# Patient Record
Sex: Female | Born: 1965
Health system: Southern US, Community
[De-identification: ages and names within clinical notes are randomized; demographics above are authoritative.]

## PROBLEM LIST (undated history)

## (undated) DIAGNOSIS — T7840XA Allergy, unspecified, initial encounter: Secondary | ICD-10-CM

## (undated) HISTORY — PX: BREAST SURGERY: SHX581

## (undated) HISTORY — PX: BREAST ENHANCEMENT SURGERY: SHX7

## (undated) HISTORY — DX: Allergy, unspecified, initial encounter: T78.40XA

## (undated) HISTORY — PX: EYE SURGERY: SHX253

---

## 1999-12-19 ENCOUNTER — Emergency Department (HOSPITAL_COMMUNITY): Admission: EM | Admit: 1999-12-19 | Discharge: 1999-12-20 | Payer: Self-pay | Admitting: Emergency Medicine

## 1999-12-20 ENCOUNTER — Encounter: Payer: Self-pay | Admitting: Emergency Medicine

## 2000-01-11 ENCOUNTER — Other Ambulatory Visit: Admission: RE | Admit: 2000-01-11 | Discharge: 2000-01-11 | Payer: Self-pay | Admitting: Obstetrics and Gynecology

## 2002-04-22 ENCOUNTER — Other Ambulatory Visit: Admission: RE | Admit: 2002-04-22 | Discharge: 2002-04-22 | Payer: Self-pay | Admitting: Obstetrics and Gynecology

## 2004-04-04 ENCOUNTER — Other Ambulatory Visit: Admission: RE | Admit: 2004-04-04 | Discharge: 2004-04-04 | Payer: Self-pay | Admitting: Obstetrics and Gynecology

## 2004-09-06 ENCOUNTER — Ambulatory Visit (HOSPITAL_COMMUNITY): Admission: RE | Admit: 2004-09-06 | Discharge: 2004-09-06 | Payer: Self-pay | Admitting: Obstetrics and Gynecology

## 2005-05-20 ENCOUNTER — Other Ambulatory Visit: Admission: RE | Admit: 2005-05-20 | Discharge: 2005-05-20 | Payer: Self-pay | Admitting: Obstetrics and Gynecology

## 2011-08-07 ENCOUNTER — Ambulatory Visit: Payer: Self-pay | Admitting: Internal Medicine

## 2011-08-07 ENCOUNTER — Ambulatory Visit (INDEPENDENT_AMBULATORY_CARE_PROVIDER_SITE_OTHER): Payer: BC Managed Care – PPO | Admitting: Internal Medicine

## 2011-08-07 DIAGNOSIS — M545 Low back pain, unspecified: Secondary | ICD-10-CM

## 2011-09-17 ENCOUNTER — Ambulatory Visit (INDEPENDENT_AMBULATORY_CARE_PROVIDER_SITE_OTHER): Payer: BC Managed Care – PPO | Admitting: Internal Medicine

## 2011-09-17 VITALS — BP 108/71 | HR 91 | Temp 97.8°F | Resp 16 | Ht 65.0 in | Wt 142.0 lb

## 2011-09-17 DIAGNOSIS — N390 Urinary tract infection, site not specified: Secondary | ICD-10-CM

## 2011-09-17 DIAGNOSIS — J309 Allergic rhinitis, unspecified: Secondary | ICD-10-CM | POA: Insufficient documentation

## 2011-09-17 DIAGNOSIS — R300 Dysuria: Secondary | ICD-10-CM

## 2011-09-17 DIAGNOSIS — J301 Allergic rhinitis due to pollen: Secondary | ICD-10-CM

## 2011-09-17 LAB — POCT URINALYSIS DIPSTICK
Bilirubin, UA: NEGATIVE
Blood, UA: NEGATIVE
Glucose, UA: NEGATIVE
Ketones, UA: NEGATIVE
Nitrite, UA: NEGATIVE
Protein, UA: NEGATIVE
Spec Grav, UA: <=1.005
Urobilinogen, UA: 0.2
pH, UA: 6

## 2011-09-17 LAB — POCT UA - MICROSCOPIC ONLY
Amorphous: POSITIVE
Casts, Ur, LPF, POC: NEGATIVE
Crystals, Ur, HPF, POC: NEGATIVE
Mucus, UA: NEGATIVE
Yeast, UA: NEGATIVE

## 2011-09-17 MED ORDER — FLUTICASONE PROPIONATE 50 MCG/ACT NA SUSP: 2.0000 | Freq: Every day | NASAL | Status: DC

## 2011-09-17 MED ORDER — CIPROFLOXACIN HCL 250 MG PO TABS: 250.0000 mg | ORAL_TABLET | Freq: Two times a day (BID) | ORAL | Status: DC

## 2011-09-17 MED ORDER — CIPROFLOXACIN HCL 250 MG PO TABS: 250.0000 mg | ORAL_TABLET | Freq: Two times a day (BID) | ORAL | Status: AC

## 2011-09-17 NOTE — Progress Notes (Signed)
  Subjective:    Patient ID: Rachael Acosta, female    DOB: 03-10-1966, 46 y.o.   MRN: [id number]  HPI Presents with a 5 day history of frequency urgency dysuria and a feeling of incomplete void Positive suprapubic pressure but no pain or hematuria and no fever C. History of past UTIs/no sexual activity in at least 6 months  Also complaining of bifrontal headaches-in the past these were treating her with allergy medicine/she is on loratadine nail but is beginning to have bifrontal headaches again/recently has had some postnasal drip and sneezing   Review of SystemsNo recent complaint Social history-for the past 3 years she has been interviewing for a 47 year old female as a possible companion in a very slow and deliberate relationship which has been healthy for her after that terrible problems of her past marriage     Objective:   Physical ExamVital signs stable HEENT is clear There no CVA tenderness No suprapubic tenderness    Results for orders placed in visit on 09/17/11  POCT UA - MICROSCOPIC ONLY      Component Value Range   WBC, Ur, HPF, POC 0-2     RBC, urine, microscopic 0-1     Bacteria, U Microscopic trace     Mucus, UA neg     Epithelial cells, urine per micros 0-3     Crystals, Ur, HPF, POC neg     Casts, Ur, LPF, POC neg     Yeast, UA neg     Amorphous pos    POCT URINALYSIS DIPSTICK      Component Value Range   Color, UA light yellow     Clarity, UA clear     Glucose, UA neg     Bilirubin, UA neg     Ketones, UA neg     Spec Grav, UA <=1.005     Blood, UA neg     pH, UA 6.0     Protein, UA neg     Urobilinogen, UA 0.2     Nitrite, UA neg     Leukocytes, UA Trace     Urine culture ordered Assessment & Plan:  Problem #1 dysuria frequency Culture done Start Cipro 250 twice a day  Problem #2 allergic rhinitis with possible sinus headache  Flonase added to loratadine and call if not better Followup for CPE as scheduled in April

## 2011-09-19 ENCOUNTER — Telehealth: Payer: Self-pay

## 2011-09-19 ENCOUNTER — Encounter: Payer: Self-pay | Admitting: Internal Medicine

## 2011-09-19 LAB — URINE CULTURE: Colony Count: >=100000

## 2011-09-19 NOTE — Telephone Encounter (Signed)
.  [facility]    PT REQUESTING MEDS FOR YEAST INFECTIION   BEST PHONE [phone]  PHARMACY [facility]

## 2011-09-20 MED ORDER — FLUCONAZOLE 150 MG PO TABS: 150.0000 mg | ORAL_TABLET | Freq: Once | ORAL | Status: AC

## 2011-09-20 NOTE — Telephone Encounter (Signed)
Patient given Cipro on 3/5... Ok to rx Diflucan?

## 2011-09-20 NOTE — Telephone Encounter (Signed)
Sent in

## 2011-09-20 NOTE — Telephone Encounter (Signed)
Pt notified of rx at pharmacy

## 2011-10-16 ENCOUNTER — Encounter: Payer: Self-pay | Admitting: Internal Medicine

## 2011-10-16 ENCOUNTER — Ambulatory Visit (INDEPENDENT_AMBULATORY_CARE_PROVIDER_SITE_OTHER): Payer: BC Managed Care – PPO | Admitting: Internal Medicine

## 2011-10-16 VITALS — BP 113/77 | HR 81 | Temp 98.4°F | Resp 16 | Ht 64.5 in | Wt 140.2 lb

## 2011-10-16 DIAGNOSIS — N390 Urinary tract infection, site not specified: Secondary | ICD-10-CM

## 2011-10-16 DIAGNOSIS — Z0000 Encounter for general adult medical examination without abnormal findings: Secondary | ICD-10-CM

## 2011-10-16 LAB — COMPREHENSIVE METABOLIC PANEL
ALT: 10 U/L (ref 0–35)
CO2: 26 mEq/L (ref 19–32)
Creat: 1 mg/dL (ref 0.50–1.10)
Total Bilirubin: 0.4 mg/dL (ref 0.3–1.2)

## 2011-10-16 LAB — POCT UA - MICROSCOPIC ONLY
Casts, Ur, LPF, POC: NEGATIVE
Crystals, Ur, HPF, POC: NEGATIVE
Mucus, UA: NEGATIVE
Yeast, UA: NEGATIVE

## 2011-10-16 LAB — LIPID PANEL
HDL: 60 mg/dL (ref >39)
Total CHOL/HDL Ratio: 2.4 Ratio
Triglycerides: 73 mg/dL (ref <150)

## 2011-10-16 LAB — CBC WITH DIFFERENTIAL/PLATELET
Basophils Absolute: 0 10*3/uL (ref 0.0–0.1)
Eosinophils Absolute: 0.1 10*3/uL (ref 0.0–0.7)
Eosinophils Relative: 4 % (ref 0–5)
MCH: 30.7 pg (ref 26.0–34.0)
MCV: 95.7 fL (ref 78.0–100.0)
Platelets: 233 10*3/uL (ref 150–400)
RDW: 13 % (ref 11.5–15.5)

## 2011-10-16 LAB — POCT URINALYSIS DIPSTICK
Blood, UA: NEGATIVE
Ketones, UA: NEGATIVE
Leukocytes, UA: NEGATIVE
Protein, UA: NEGATIVE
Spec Grav, UA: 1.015
pH, UA: 7

## 2011-10-16 LAB — TSH: TSH: 0.851 u[IU]/mL (ref 0.350–4.500)

## 2011-10-16 NOTE — Progress Notes (Signed)
  Subjective:    Patient ID: Rachael Acosta, female    DOB: 1966/06/29, 46 y.o.   MRN: [id number]  HPI sHe is doing very well in general/,Continues to work in retail,Work works out regularly, and has an interesting life. She lives in her arranged marriage for almost 20 years but was never satisfactory. She now is in a new relationship that is more one of companionship and she has several questions about how she should feel in this relationship.  She had a recent illness 4 days ago that started with fever and night sweats but without other focal findings/she is still tired and washed out from this illness  She had allergic rhinitis earlier this month which has improved with Flonase and Claritin   Review of Systems  Constitutional: Negative.   HENT: Negative.   Eyes: Negative.   Respiratory: Negative.   Cardiovascular: Negative.   Gastrointestinal: Negative.   Genitourinary:       She has had several UTIs and recently was treated though her culture proved negative. She has known fibroids and is followed by Dr. Jackelyn Knife. She has an upcoming appointment with him to discuss whether her lower quadrant symptoms with occasional urinary frequency are related to a GYN problem. She has questions about an unfulfilled intimate life which we discussed at length/referred to our bodies ourselves  Musculoskeletal: Negative.   Skin: Negative.   Neurological: Negative.   Hematological: Negative.   Psychiatric/Behavioral: Negative.        Objective:   Physical Exam Vital signs stable Well-developed well-nourished in no acute distress HEENT is clear without thyromegaly or lymphadenopathy Heart is regular without murmurs rubs or gallops Breasts are augmented Lungs are clear The abdomen is soft nontender and nondistended with no organomegaly There is a full range of motion about the hips knees and shoulders No peripheral edema/4 peripheral pulses Neurological intact Psychiatric intact         Assessment & Plan:  Annual exam Problem #1 allergic rhinitis  Continue Flonase and Claritin Problem #2 concern for thyroid illness or diabetes on her part  Will screen for both  In general she is very healthy and is encouraged to continue what she is doing

## 2011-10-17 ENCOUNTER — Encounter: Payer: Self-pay | Admitting: Internal Medicine

## 2011-10-22 ENCOUNTER — Telehealth: Payer: Self-pay

## 2011-10-22 NOTE — Telephone Encounter (Signed)
PATIENT WANTS TO KNOW IF HER LAB RESULTS ARE BACK

## 2011-10-23 NOTE — Telephone Encounter (Signed)
Call her I mailed her a letter with all of her lab results on the seventh Everything is normal

## 2011-10-24 NOTE — Telephone Encounter (Signed)
Gave pt Dr Doolittle's message

## 2011-12-04 ENCOUNTER — Other Ambulatory Visit: Payer: Self-pay | Admitting: Family Medicine

## 2011-12-04 MED ORDER — LORATADINE 10 MG PO TABS: 10.0000 mg | ORAL_TABLET | Freq: Every day | ORAL | Status: DC

## 2012-01-12 ENCOUNTER — Ambulatory Visit (INDEPENDENT_AMBULATORY_CARE_PROVIDER_SITE_OTHER): Payer: BC Managed Care – PPO | Admitting: Internal Medicine

## 2012-01-12 VITALS — BP 104/67 | HR 67 | Temp 98.2°F | Resp 16 | Ht 64.0 in | Wt 141.8 lb

## 2012-01-12 DIAGNOSIS — G8929 Other chronic pain: Secondary | ICD-10-CM

## 2012-01-12 DIAGNOSIS — R1032 Left lower quadrant pain: Secondary | ICD-10-CM

## 2012-01-12 DIAGNOSIS — G4700 Insomnia, unspecified: Secondary | ICD-10-CM

## 2012-01-12 LAB — POCT URINALYSIS DIPSTICK
Leukocytes, UA: NEGATIVE
Protein, UA: NEGATIVE
Urobilinogen, UA: 0.2
pH, UA: 6.5

## 2012-01-12 LAB — POCT UA - MICROSCOPIC ONLY
Amorphous: POSITIVE
Casts, Ur, LPF, POC: NEGATIVE
Yeast, UA: NEGATIVE

## 2012-01-12 MED ORDER — ZOLPIDEM TARTRATE 10 MG PO TABS: 10.0000 mg | ORAL_TABLET | Freq: Every evening | ORAL | Status: DC | PRN

## 2012-01-12 NOTE — Patient Instructions (Signed)
Back Pain, Adult Low back pain is very common. About 1 in 5 people have back pain.The cause of low back pain is rarely dangerous. The pain often gets better over time.About half of people with a sudden onset of back pain feel better in just 2 weeks. About 8 in 10 people feel better by 6 weeks.  CAUSES Some common causes of back pain include:  Strain of the muscles or ligaments supporting the spine.   Wear and tear (degeneration) of the spinal discs.   Arthritis.   Direct injury to the back.  DIAGNOSIS Most of the time, the direct cause of low back pain is not known.However, back pain can be treated effectively even when the exact cause of the pain is unknown.Answering your caregiver's questions about your overall health and symptoms is one of the most accurate ways to make sure the cause of your pain is not dangerous. If your caregiver needs more information, he or she may order lab work or imaging tests (X-rays or MRIs).However, even if imaging tests show changes in your back, this usually does not require surgery. HOME CARE INSTRUCTIONS For many people, back pain returns.Since low back pain is rarely dangerous, it is often a condition that people can learn to manageon their own.   Remain active. It is stressful on the back to sit or stand in one place. Do not sit, drive, or stand in one place for more than 30 minutes at a time. Take short walks on level surfaces as soon as pain allows.Try to increase the length of time you walk each day.   Do not stay in bed.Resting more than 1 or 2 days can delay your recovery.   Do not avoid exercise or work.Your body is made to move.It is not dangerous to be active, even though your back may hurt.Your back will likely heal faster if you return to being active before your pain is gone.   Pay attention to your body when you bend and lift. Many people have less discomfortwhen lifting if they bend their knees, keep the load close to their  bodies,and avoid twisting. Often, the most comfortable positions are those that put less stress on your recovering back.   Find a comfortable position to sleep. Use a firm mattress and lie on your side with your knees slightly bent. If you lie on your back, put a pillow under your knees.   Only take over-the-counter or prescription medicines as directed by your caregiver. Over-the-counter medicines to reduce pain and inflammation are often the most helpful.Your caregiver may prescribe muscle relaxant drugs.These medicines help dull your pain so you can more quickly return to your normal activities and healthy exercise.   Put ice on the injured area.   Put ice in a plastic bag.   Place a towel between your skin and the bag.   Leave the ice on for 15 to 20 minutes, 3 to 4 times a day for the first 2 to 3 days. After that, ice and heat may be alternated to reduce pain and spasms.   Ask your caregiver about trying back exercises and gentle massage. This may be of some benefit.   Avoid feeling anxious or stressed.Stress increases muscle tension and can worsen back pain.It is important to recognize when you are anxious or stressed and learn ways to manage it.Exercise is a great option.  SEEK MEDICAL CARE IF:  You have pain that is not relieved with rest or medicine.   You have   pain that does not improve in 1 week.   You have new symptoms.   You are generally not feeling well.  SEEK IMMEDIATE MEDICAL CARE IF:   You have pain that radiates from your back into your legs.   You develop new bowel or bladder control problems.   You have unusual weakness or numbness in your arms or legs.   You develop nausea or vomiting.   You develop abdominal pain.   You feel faint.  Document Released: 07/01/2005 Document Revised: 06/20/2011 Document Reviewed: 11/19/2010 [facility] Patient Information 2012 [place], [place].Abdominal Pain Abdominal pain can be caused by many things. Your caregiver  decides the seriousness of your pain by an examination and possibly blood tests and X-rays. Many cases can be observed and treated at home. Most abdominal pain is not caused by a disease and will probably improve without treatment. However, in many cases, more time must pass before a clear cause of the pain can be found. Before that point, it may not be known if you need more testing, or if hospitalization or surgery is needed. HOME CARE INSTRUCTIONS   Do not take laxatives unless directed by your caregiver.   Take pain medicine only as directed by your caregiver.   Only take over-the-counter or prescription medicines for pain, discomfort, or fever as directed by your caregiver.   Try a clear liquid diet (broth, tea, or water) for as long as directed by your caregiver. Slowly move to a bland diet as tolerated.  SEEK IMMEDIATE MEDICAL CARE IF:   The pain does not go away.   You have a fever.   You keep throwing up (vomiting).   The pain is felt only in portions of the abdomen. Pain in the right side could possibly be appendicitis. In an adult, pain in the left lower portion of the abdomen could be colitis or diverticulitis.   You pass bloody or black tarry stools.  MAKE SURE YOU:   Understand these instructions.   Will watch your condition.   Will get help right away if you are not doing well or get worse.  Document Released: 04/10/2005 Document Revised: 06/20/2011 Document Reviewed: 02/17/2008 [facility] Patient Information 2012 [place], [place].

## 2012-01-12 NOTE — Progress Notes (Addendum)
  Subjective:    Patient ID: Rachael Acosta, female    DOB: [birth year], 46 y.o.   MRN: [id number]  HPI Painful abdominal LLQ known hx of fibroids.Pain from back since August, left lower lumbar radiates into LLQ has gone for therapy-[facility]'Hall[facility]- without much relief of pain. No fever last menstral cycle was normal June 1 although had shorter cycle than usual 1-2 days. Denies constipation.Not sexually active. No vaginal discharge. No dysuria or frequency. No followup with GYN regarding fibroids.  Review of Systems  Constitutional: Negative for fever, chills, activity change, appetite change and fatigue.   trouble falling asleep In the past that has responded to Ambien and she is out Her life is changing/selling her house/moving back in with her parents/left her boy friend/llooking for a new life      Objective:   Physical Exam Tenderness lower lumbar area neg SLR bilateral.  Abdomen soft with no organomegaly. Tender to deep palpation in the left lower quadrant without rebound tenderness = Pelvic= introitus clear Uterus anterior nontender Right adnexa clear/left adnexa tender to bimanual without clear mass Posterior wall of the uterus is tender No stool in the rectum       Assessment & Plan:  Problem #1 recurrent back pain Problem #2 left lower quadrant pelvic discomfort Problem #3 insomnia Problem #4 episodic urinary frequency including nocturia  Urinalysis today=Normal except for a few red cells  differential could include kidney stone or cyst ovary,outflow obstruction, or IC  Patient to be scheduled for ultrasound tomorrow to evaluate for fibroids vs ovarian cyst.  Patients mother is giving her medications which are helpful, Piroxicam, pt will continue taking this medication Meds ordered this encounter  Medications  . zolpidem (AMBIEN) 10 MG tablet    Sig: Take 1 tablet (10 mg total) by mouth at bedtime as needed for sleep.    Dispense:  15 tablet    Refill:  1   01/22/12  Addendum: Ultrasound shows small fibroids which should not account for this pain She needs orthopedic evaluation due to the persistence of her lumbar pain and urologic evaluation for #4 above

## 2012-01-13 NOTE — Addendum Note (Signed)
Addended by: Thelma Barge D on: 01/13/2012 10:15 AM   Modules accepted: Orders

## 2012-01-17 ENCOUNTER — Encounter: Payer: Self-pay | Admitting: Internal Medicine

## 2012-01-17 ENCOUNTER — Ambulatory Visit
Admission: RE | Admit: 2012-01-17 | Discharge: 2012-01-17 | Disposition: A | Discharge status: Home / Self Care | Payer: BC Managed Care – PPO | Source: Ambulatory Visit | Attending: Internal Medicine | Admitting: Internal Medicine

## 2012-01-17 ENCOUNTER — Telehealth: Payer: Self-pay | Admitting: Internal Medicine

## 2012-01-17 DIAGNOSIS — G8929 Other chronic pain: Secondary | ICD-10-CM

## 2012-01-17 DIAGNOSIS — R1032 Left lower quadrant pain: Secondary | ICD-10-CM

## 2012-01-20 NOTE — Telephone Encounter (Signed)
Decided to send letter instead

## 2012-01-22 ENCOUNTER — Telehealth: Payer: Self-pay

## 2012-01-22 DIAGNOSIS — R350 Frequency of micturition: Secondary | ICD-10-CM

## 2012-01-22 DIAGNOSIS — M545 Low back pain, unspecified: Secondary | ICD-10-CM

## 2012-01-22 NOTE — Progress Notes (Deleted)
  Subjective:    Patient ID: Rachael Acosta, female    DOB: 19-Sep-1965, 46 y.o.   MRN: [id number]  HPI    Review of Systems     Objective:   Physical Exam        Assessment & Plan:

## 2012-01-22 NOTE — Telephone Encounter (Signed)
The patient called to request test results done 01/12/12.  Please call the patient with results at work # [phone] until 6pm or on cell phone at [phone].

## 2012-01-22 NOTE — Telephone Encounter (Signed)
Pelvic ultrasound is normal except for small fibroid/she has had one ablation/This seems too small to be causing her back pain and urinary frequency Since she has day and nighttime frequency without symptoms of infection Will set up a urology evaluation to rule out obstruction and cystitis causes We will proceed also with an orthopedic evaluation for this persistent left lumbar pain with activity

## 2012-01-22 NOTE — Telephone Encounter (Signed)
Pt really wanted U/S results. Dr. Merla Riches can you please review. Thanks

## 2012-01-29 ENCOUNTER — Telehealth: Payer: Self-pay

## 2012-01-29 NOTE — Telephone Encounter (Signed)
Pharmacy changed

## 2012-01-29 NOTE — Telephone Encounter (Signed)
The patient called to have her pharmacy changed in the system to [facility] on [address] and [address] so that her next refill will go to the correct pharmacy.

## 2012-03-04 ENCOUNTER — Ambulatory Visit: Payer: BC Managed Care – PPO | Admitting: Internal Medicine

## 2012-04-10 ENCOUNTER — Ambulatory Visit (INDEPENDENT_AMBULATORY_CARE_PROVIDER_SITE_OTHER): Payer: BC Managed Care – PPO | Admitting: Physician Assistant

## 2012-04-10 VITALS — BP 112/70 | HR 93 | Temp 97.0°F | Resp 16 | Ht 64.0 in | Wt 138.0 lb

## 2012-04-10 DIAGNOSIS — R300 Dysuria: Secondary | ICD-10-CM

## 2012-04-10 DIAGNOSIS — N390 Urinary tract infection, site not specified: Secondary | ICD-10-CM

## 2012-04-10 LAB — POCT URINALYSIS DIPSTICK
Bilirubin, UA: NEGATIVE
Glucose, UA: NEGATIVE
Ketones, UA: NEGATIVE
Nitrite, UA: NEGATIVE

## 2012-04-10 LAB — POCT UA - MICROSCOPIC ONLY
Mucus, UA: NEGATIVE
Yeast, UA: NEGATIVE

## 2012-04-10 MED ORDER — CIPROFLOXACIN HCL 500 MG PO TABS: 500.0000 mg | ORAL_TABLET | Freq: Two times a day (BID) | ORAL | Status: DC

## 2012-04-10 NOTE — Progress Notes (Signed)
   [address] [place] [address]   Phone [phone]  Subjective:    Patient ID: Rachael Acosta, female    DOB: 1966/04/25, 46 y.o.   MRN: [id number]  HPI Pt presents to clinic with 12h h/o dysuria and urinary frequency.  Early this week had a couple of days that she did not drink her regular amount of water.  Symptoms started this afternoon and they have worsened.  Some abd pressure but no pain.  No flank or back pain, no N or fever.  She has had no new sexual encounters and has no vaginal d/c.   Review of Systems  Constitutional: Negative for fever and chills.  Gastrointestinal: Positive for abdominal pain (pressure). Negative for nausea.  Genitourinary: Positive for dysuria, urgency and frequency. Negative for hematuria, vaginal discharge and difficulty urinating.  Musculoskeletal: Negative for back pain.       Objective:   Physical Exam  Vitals reviewed. Constitutional: She appears well-developed and well-nourished.  HENT:  Head: Atraumatic.  Right Ear: External ear normal.  Left Ear: External ear normal.  Eyes: Conjunctivae normal are normal.  Cardiovascular: Normal rate, regular rhythm and normal heart sounds.   No murmur heard. Pulmonary/Chest: Effort normal and breath sounds normal.  Abdominal: Soft. There is tenderness (suprapubic) in the suprapubic area. There is no guarding and no CVA tenderness.  Neurological: She is alert.  Skin: Skin is warm and dry.  Psychiatric: She has a normal mood and affect. Her behavior is normal. Judgment and thought content normal.   Results for orders placed in visit on 04/10/12  POCT UA - MICROSCOPIC ONLY      Component Value Range   WBC, Ur, HPF, POC tntc     RBC, urine, microscopic tntc     Bacteria, U Microscopic 2+     Mucus, UA neg     Epithelial cells, urine per micros 2-4     Crystals, Ur, HPF, POC neg     Casts, Ur, LPF, POC neg'     Yeast, UA neg    POCT URINALYSIS DIPSTICK      Component Value Range   Color, UA pale      Clarity, UA cloudy     Glucose, UA neg     Bilirubin, UA neg     Ketones, UA neg     Spec Grav, UA 1.010     Blood, UA large     pH, UA 7.0     Protein, UA neg     Urobilinogen, UA 0.2     Nitrite, UA neg     Leukocytes, UA large (3+)           Assessment & Plan:   1. Dysuria  POCT UA - Microscopic Only, POCT urinalysis dipstick  2. Urinary tract infection, site not specified  Urine culture, ciprofloxacin (CIPRO) 500 MG tablet

## 2012-04-10 NOTE — Patient Instructions (Signed)
To take abx and push fluids.

## 2012-04-13 LAB — URINE CULTURE: Colony Count: >=100000

## 2012-05-21 ENCOUNTER — Ambulatory Visit (INDEPENDENT_AMBULATORY_CARE_PROVIDER_SITE_OTHER): Payer: BC Managed Care – PPO | Admitting: Family Medicine

## 2012-05-21 VITALS — BP 120/74 | HR 74 | Temp 97.6°F | Resp 16 | Ht 63.5 in | Wt 139.0 lb

## 2012-05-21 DIAGNOSIS — R002 Palpitations: Secondary | ICD-10-CM

## 2012-05-21 DIAGNOSIS — L282 Other prurigo: Secondary | ICD-10-CM

## 2012-05-21 DIAGNOSIS — L309 Dermatitis, unspecified: Secondary | ICD-10-CM

## 2012-05-21 LAB — POCT CBC
Granulocyte percent: 59.9 %G (ref 37–80)
HCT, POC: 40.2 % (ref 37.7–47.9)
Hemoglobin: 12.3 g/dL (ref 12.2–16.2)
MCH, POC: 29.9 pg (ref 27–31.2)
MCV: 97.5 fL — AB (ref 80–97)
MID (cbc): 0.4 (ref 0–0.9)
RBC: 4.12 M/uL (ref 4.04–5.48)
WBC: 6.8 10*3/uL (ref 4.6–10.2)

## 2012-05-21 MED ORDER — PREDNISONE 20 MG PO TABS: ORAL_TABLET | ORAL | Status: DC

## 2012-05-21 MED ORDER — METHYLPREDNISOLONE ACETATE 80 MG/ML IJ SUSP
40.0000 mg | Freq: Once | INTRAMUSCULAR | Status: AC
  Administered 2012-05-21: 40 mg via INTRAMUSCULAR

## 2012-05-21 NOTE — Progress Notes (Signed)
[address]   [place], [place]  [address]   [phone]  Subjective:    Patient ID: Rachael Acosta, female    DOB: 02/21/1966, 46 y.o.   MRN: [id number]  HPIThis 47 y.o. female presents for evaluation of the following:  1.  Rash:  Onset five days ago.  +itching.  Diffuse rash.  Onset on back and spread to abdomen, arms, legs.  Mostly upper body location.  No changes in soaps, shampoos, lotions, detergents, fabric softeners.  No new medications.  No unusual foods.  No recent travel other than [place] last week.  Friend gave topical cream and Hydroxyzine 25mg  without improvement.  No family members with similar rash.  No palm or sole involvement.  No genital involvement; no mucosal involvement.  Scalp also itches.  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  No new pets; no exposure to animals.  2. Rapid heart rate:  Rapid today.  Feels heat in ears.  No fever/chills/sweats.No sore throat, cough, rhinorrhea, nasal congestion.  No chest pain, shortness of breath, leg swelling.  Benadryl x 2 today.     Review of Systems  Constitutional: Negative for fever, chills, diaphoresis and fatigue.  HENT: Negative for congestion, sore throat, rhinorrhea, voice change and postnasal drip.   Respiratory: Negative for cough, shortness of breath and wheezing.   Cardiovascular: Positive for palpitations. Negative for chest pain and leg swelling.  Skin: Positive for rash. Negative for color change, pallor and wound.  Hematological: Does not bruise/bleed easily.    No past medical history on file.  No past surgical history on file.  Prior to Admission medications   Medication Sig Start Date End Date Taking? Authorizing Provider  Cholecalciferol (VITAMIN D3 PO) Take by mouth.   Yes Historical Provider, MD  fluticasone (FLONASE) 50 MCG/ACT nasal spray Place 2 sprays into the nose daily. 09/17/11 09/16/12 Yes Tonye Pearson, MD  loratadine (CLARITIN) 10 MG tablet Take 1 tablet (10 mg total) by mouth daily. 12/04/11   Yes Ryan M Dunn, PA-C  Multiple Vitamin (MULTIVITAMIN) tablet Take 1 tablet by mouth daily.   Yes Historical Provider, MD  ciprofloxacin (CIPRO) 500 MG tablet Take 1 tablet (500 mg total) by mouth 2 (two) times daily. 04/10/12   Morrell Riddle, PA-C  zolpidem (AMBIEN) 10 MG tablet Take 1 tablet (10 mg total) by mouth at bedtime as needed for sleep. 01/12/12 02/11/12  Tonye Pearson, MD    No Known Allergies  History   Social History  . Marital Status: Married    Spouse Name: N/A    Number of Children: N/A  . Years of Education: N/A   Occupational History  . Not on file.   Social History Main Topics  . Smoking status: Never Smoker   . Smokeless tobacco: Not on file  . Alcohol Use: Yes     Comment: occassionally  . Drug Use: No  . Sexually Active: Yes   Other Topics Concern  . Not on file   Social History Narrative  . No narrative on file    Family History  Problem Relation Age of Onset  . Hypertension Mother         Objective:   Physical Exam  Constitutional: She is oriented to person, place, and time. She appears well-developed and well-nourished. No distress.  HENT:  Head: Normocephalic and atraumatic.  Right Ear: External ear normal.  Left Ear: External ear normal.  Nose: Nose normal.  Mouth/Throat: Oropharynx is clear and moist.  Eyes:  Conjunctivae normal are normal. Pupils are equal, round, and reactive to light.  Neck: Normal range of motion. Neck supple. No thyromegaly present.  Cardiovascular: Normal rate, regular rhythm, normal heart sounds and intact distal pulses.   No murmur heard. Pulmonary/Chest: Effort normal and breath sounds normal. No respiratory distress. She has no wheezes. She has no rales.  Lymphadenopathy:    She has no cervical adenopathy.  Neurological: She is alert and oriented to person, place, and time.  Skin: She is not diaphoretic.       Fine papular rash along back, BUE, abdomen.  No palmar involvement.  No vesicles or pustules.   No induration.  +excoriations along B arms.  Psychiatric: She has a normal mood and affect. Her behavior is normal. Judgment and thought content normal.     Zyrtec 10mg  po in office. Ranitidine 150mg  po in office. Depomedrol 40mg  IM in office.  Results for orders placed in visit on 05/21/12  POCT CBC      Component Value Range   WBC 6.8  4.6 - 10.2 K/uL   Lymph, poc 2.4  0.6 - 3.4   POC LYMPH PERCENT 34.6  10 - 50 %L   MID (cbc) 0.4  0 - 0.9   POC MID % 5.5  0 - 12 %M   POC Granulocyte 4.1  2 - 6.9   Granulocyte percent 59.9  37 - 80 %G   RBC 4.12  4.04 - 5.48 M/uL   Hemoglobin 12.3  12.2 - 16.2 g/dL   HCT, POC 16.1  09.6 - 47.9 %   MCV 97.5 (*) 80 - 97 fL   MCH, POC 29.9  27 - 31.2 pg   MCHC 30.6 (*) 31.8 - 35.4 g/dL   RDW, POC 04.5     Platelet Count, POC 240  142 - 424 K/uL   MPV 8.7  0 - 99.8 fL    EKG: NSR; NO ACUTE CHANGES.  RATE 64.    Assessment & Plan:   1. Pruritic rash  POCT CBC, POCT SEDIMENTATION RATE, Comprehensive metabolic panel, TSH, EKG 12-Lead, methylPREDNISolone acetate (DEPO-MEDROL) injection 40 mg  2. Palpitations  Comprehensive metabolic panel, TSH, EKG 12-Lead    1. Pruritic Rash:  New. Etiology unclear.  S/p Depomedrol IM in office; s/p Zyrtec 10mg  and Ranitidine 150mg  po in office.  Rx for Prednisone taper provided; RTC for acute worsening or lack of improvement. 2. Palpitations;  New.  Normal EKG in office; obtain labs.  If persists, will warrant referral to cardiology for Holter monitor; pt expressed understanding; desires to monitor over next several weeks.    Meds ordered this encounter  Medications  . methylPREDNISolone acetate (DEPO-MEDROL) injection 40 mg    Sig:   . predniSONE (DELTASONE) 20 MG tablet    Sig: Two tablets daily x 5 days, then one tablet daily x 5 days    Dispense:  15 tablet    Refill:  0

## 2012-05-21 NOTE — Patient Instructions (Addendum)
1. Pruritic rash  POCT CBC, POCT SEDIMENTATION RATE, Comprehensive metabolic panel, TSH, EKG 12-Lead, methylPREDNISolone acetate (DEPO-MEDROL) injection 40 mg, predniSONE (DELTASONE) 20 MG tablet  2. Palpitations  Comprehensive metabolic panel, TSH, EKG 12-Lead  3. Dermatitis        START ZYRTEC 10MG  ONCE DAILY. START ZANTAC/RANITIDINE 150MG  ONE PILL TWICE DAILY TAKE BENADRYL 25MG  ONE AT BEDTIME.

## 2012-05-22 LAB — COMPREHENSIVE METABOLIC PANEL
Albumin: 4.6 g/dL (ref 3.5–5.2)
Alkaline Phosphatase: 48 U/L (ref 39–117)
BUN: 17 mg/dL (ref 6–23)
Calcium: 10.1 mg/dL (ref 8.4–10.5)
Chloride: 98 mEq/L (ref 96–112)
Glucose, Bld: 76 mg/dL (ref 70–99)
Potassium: 4.3 mEq/L (ref 3.5–5.3)
Sodium: 140 mEq/L (ref 135–145)
Total Protein: 8 g/dL (ref 6.0–8.3)

## 2012-07-17 ENCOUNTER — Other Ambulatory Visit: Payer: Self-pay | Admitting: Internal Medicine

## 2012-07-17 ENCOUNTER — Telehealth: Payer: Self-pay

## 2012-07-17 DIAGNOSIS — G4700 Insomnia, unspecified: Secondary | ICD-10-CM

## 2012-07-17 MED ORDER — ZOLPIDEM TARTRATE 10 MG PO TABS: 5.0000 mg | ORAL_TABLET | Freq: Every evening | ORAL | Status: DC | PRN

## 2012-07-17 NOTE — Telephone Encounter (Signed)
Ok Meds ordered this encounter  Medications  . zolpidem (AMBIEN) 10 MG tablet    Sig: Take 0.5 tablets (5 mg total) by mouth at bedtime as needed for sleep.    Dispense:  15 tablet    Refill:  1

## 2012-07-17 NOTE — Telephone Encounter (Signed)
Pt would like a sleeping pill called for her the name of the sleeping pill is zolpodem something says that dr Merla Riches has called this in for her before best number to call her is [phone]

## 2012-07-17 NOTE — Telephone Encounter (Signed)
Please advise on renewal of Ambien/ pended

## 2012-07-19 NOTE — Telephone Encounter (Signed)
LMOM THAT RX WAS SENT TO PHARMACY

## 2012-07-21 ENCOUNTER — Ambulatory Visit (INDEPENDENT_AMBULATORY_CARE_PROVIDER_SITE_OTHER): Payer: BC Managed Care – PPO | Admitting: Internal Medicine

## 2012-07-21 VITALS — BP 100/68 | HR 71 | Temp 98.0°F | Resp 16 | Ht 64.5 in | Wt 140.0 lb

## 2012-07-21 DIAGNOSIS — J069 Acute upper respiratory infection, unspecified: Secondary | ICD-10-CM

## 2012-07-21 DIAGNOSIS — R300 Dysuria: Secondary | ICD-10-CM

## 2012-07-21 DIAGNOSIS — N390 Urinary tract infection, site not specified: Secondary | ICD-10-CM

## 2012-07-21 DIAGNOSIS — R350 Frequency of micturition: Secondary | ICD-10-CM

## 2012-07-21 LAB — POCT URINALYSIS DIPSTICK
Ketones, UA: NEGATIVE
Protein, UA: 30
Spec Grav, UA: 1.02
Urobilinogen, UA: 0.2
pH, UA: 8

## 2012-07-21 LAB — POCT UA - MICROSCOPIC ONLY
Amorphous, UA: POSITIVE
Casts, Ur, LPF, POC: NEGATIVE
Mucus, UA: POSITIVE
Yeast, UA: NEGATIVE

## 2012-07-21 MED ORDER — CETIRIZINE HCL 10 MG PO TABS: 10.0000 mg | ORAL_TABLET | Freq: Every day | ORAL | Status: DC

## 2012-07-21 MED ORDER — AZELASTINE HCL 0.1 % NA SOLN: 1.0000 | Freq: Two times a day (BID) | NASAL | Status: DC

## 2012-07-21 MED ORDER — CIPROFLOXACIN HCL 500 MG PO TABS: 500.0000 mg | ORAL_TABLET | Freq: Two times a day (BID) | ORAL | Status: DC

## 2012-07-21 NOTE — Progress Notes (Signed)
  Subjective:    Patient ID: Rachael Acosta, female    DOB: [birth year], 47 y.o.   MRN: [id number]  HPI presents today with head congestion for the past couple weeks. Wants something stronger than loratadine. Has been having urinary frequency and dysuria. Has hx frequent UTI. Last cx showed Ecoli sensitive to all. No fever no cough    Review of Systems     Objective:   Physical Exam Results for orders placed in visit on 07/21/12  POCT URINALYSIS DIPSTICK      Component Value Range   Color, UA yellow     Clarity, UA cloudy     Glucose, UA neg     Bilirubin, UA neg     Ketones, UA neg     Spec Grav, UA 1.020     Blood, UA large     pH, UA 8.0     Protein, UA 30     Urobilinogen, UA 0.2     Nitrite, UA neg     Leukocytes, UA moderate (2+)    POCT UA - MICROSCOPIC ONLY      Component Value Range   WBC, Ur, HPF, POC TNTC     RBC, urine, microscopic 8-12     Bacteria, U Microscopic 2+     Mucus, UA positive     Epithelial cells, urine per micros 8-10     Crystals, Ur, HPF, POC neg     Casts, Ur, LPF, POC neg     Yeast, UA neg     Amorphous, UA positive             Assessment & Plan:  UTI/allergy Cipro/Zyrtec UTI care

## 2012-07-21 NOTE — Progress Notes (Signed)
  Subjective:    Patient ID: Rachael Acosta, female    DOB: 02-09-1966, 47 y.o.   MRN: [id number]  HPI Has uti and uri sxs.  Review of Systems     Objective:   Physical Exam        Assessment & Plan:

## 2012-07-21 NOTE — Patient Instructions (Addendum)
Urinary Tract Infection Urinary tract infections (UTIs) can develop anywhere along your urinary tract. Your urinary tract is your body's drainage system for removing wastes and extra water. Your urinary tract includes two kidneys, two ureters, a bladder, and a urethra. Your kidneys are a pair of bean-shaped organs. Each kidney is about the size of your fist. They are located below your ribs, one on each side of your spine. CAUSES Infections are caused by microbes, which are microscopic organisms, including fungi, viruses, and bacteria. These organisms are so small that they can only be seen through a microscope. Bacteria are the microbes that most commonly cause UTIs. SYMPTOMS  Symptoms of UTIs may vary by age and gender of the patient and by the location of the infection. Symptoms in young women typically include a frequent and intense urge to urinate and a painful, burning feeling in the bladder or urethra during urination. Older women and men are more likely to be tired, shaky, and weak and have muscle aches and abdominal pain. A fever may mean the infection is in your kidneys. Other symptoms of a kidney infection include pain in your back or sides below the ribs, nausea, and vomiting. DIAGNOSIS To diagnose a UTI, your caregiver will ask you about your symptoms. Your caregiver also will ask to provide a urine sample. The urine sample will be tested for bacteria and white blood cells. White blood cells are made by your body to help fight infection. TREATMENT  Typically, UTIs can be treated with medication. Because most UTIs are caused by a bacterial infection, they usually can be treated with the use of antibiotics. The choice of antibiotic and length of treatment depend on your symptoms and the type of bacteria causing your infection. HOME CARE INSTRUCTIONS  If you were prescribed antibiotics, take them exactly as your caregiver instructs you. Finish the medication even if you feel better after you  have only taken some of the medication.  Drink enough water and fluids to keep your urine clear or pale yellow.  Avoid caffeine, tea, and carbonated beverages. They tend to irritate your bladder.  Empty your bladder often. Avoid holding urine for long periods of time.  Empty your bladder before and after sexual intercourse.  After a bowel movement, women should cleanse from front to back. Use each tissue only once. SEEK MEDICAL CARE IF:   You have back pain.  You develop a fever.  Your symptoms do not begin to resolve within 3 days. SEEK IMMEDIATE MEDICAL CARE IF:   You have severe back pain or lower abdominal pain.  You develop chills.  You have nausea or vomiting.  You have continued burning or discomfort with urination. MAKE SURE YOU:   Understand these instructions.  Will watch your condition.  Will get help right away if you are not doing well or get worse. Document Released: 04/10/2005 Document Revised: 12/31/2011 Document Reviewed: 08/09/2011 [facility] Patient Information 2013 [place], [place]. Hay Fever  Hay fever is a type of allergy that people have to things like grass, animals, or pollen from plants and flowers. It cannot be passed from one person to another. You cannot cure hay fever, but there are things that may help relieve your problems (symptoms). HOME CARE  Avoid the things that may be causing your problems.  Take all medicine as told by your doctor. GET HELP RIGHT AWAY IF:  You have asthma, a cough, and you start making whistling sounds when breathing (wheezing).  Your tongue or lips are  puffy (swollen).  You have trouble breathing.  You feel lightheaded or like you will pass out (faint).  You have a fever.  Your problems are getting worse and your medicine is not helping.  Your treatment was working, but your problems have come back.  You are stuffed up (congested) and have pressure in your face.  You have a headache.  You have cold  sweats. MAKE SURE YOU:  Understand these instructions.  Will watch your condition.  Will get help right away if you are not doing well or get worse. Document Released: 10/31/2010 Document Revised: 09/23/2011 Document Reviewed: 10/31/2010 [facility] Patient Information 2013 [place], [place].

## 2012-07-28 ENCOUNTER — Telehealth: Payer: Self-pay

## 2012-07-28 NOTE — Telephone Encounter (Signed)
Motion sickness pills for long trip coming up on the 30th  [facility] on [address]  Please call to let patient know this is ready.   [phone]

## 2012-07-29 NOTE — Telephone Encounter (Signed)
PT CALLED BACK BECAUSE SHE HAS NOT HEARD FROM [place].  SHE USES MOTION SICKNESS PILLS FOR TRAVEL VIA FLYING.  PLEASE CALL ASAP [phone]

## 2012-07-29 NOTE — Telephone Encounter (Signed)
What has she used for motion sickness in the past?

## 2012-07-29 NOTE — Telephone Encounter (Signed)
Spoke w/pt and she couldn't remember the name of what we Rxd before but we did Rx it for her. She doesn't travel until the 30th. I advised pt that I will check her paper chart tomorrow and see if I can see what we Rxd before and we will cB after provider reviews request.

## 2012-07-30 MED ORDER — SCOPOLAMINE 1 MG/3DAYS TD PT72: 1.0000 | MEDICATED_PATCH | TRANSDERMAL | Status: DC

## 2012-07-30 NOTE — Telephone Encounter (Signed)
I called patient to advise.  

## 2012-07-30 NOTE — Telephone Encounter (Signed)
Checked pt's chart [place] [id number] and we had Rxd the Transcop patch for her in the past for travel. Chart is at [facility] desk w/page marked if needed.

## 2012-07-30 NOTE — Telephone Encounter (Signed)
I sent in the transderm scop - please tell pt that these are patches that go behind the ear and make sure that is what she is talking about.

## 2012-08-03 ENCOUNTER — Telehealth: Payer: Self-pay

## 2012-08-03 MED ORDER — MECLIZINE HCL 25 MG PO TABS: 25.0000 mg | ORAL_TABLET | Freq: Three times a day (TID) | ORAL | Status: DC | PRN

## 2012-08-03 MED ORDER — MECLIZINE HCL 32 MG PO TABS: 32.0000 mg | ORAL_TABLET | Freq: Three times a day (TID) | ORAL | Status: DC | PRN

## 2012-08-03 NOTE — Telephone Encounter (Signed)
PT OF [facility].  SHE HAD REQUESTED A MOTION SICKNESS MEDICINE FOR HER FLIGHT (I THINK IT IS THIS WEEKEND).  THE MEDICINE THAT WAS PRESCRIBED SHE SAID WAS INCREDIBLY EXPENSIVE.  WANTS TO KNOW IF WE CAN CALL IN SOMETHING DIFFERENT, SAYS SHE PREFERS A PILL FORM OF THE MEDICINE.  [phone]

## 2012-08-03 NOTE — Telephone Encounter (Signed)
Resent. 32 mg not available any longer, 25 mg. Per Lanora Manis.

## 2012-08-03 NOTE — Telephone Encounter (Signed)
Meclizine sent to pharmacy, hopefull this will be less expensive

## 2012-08-03 NOTE — Telephone Encounter (Signed)
See 07/28/12 phone message. Can we send in a Rx for less expensive med?

## 2012-08-03 NOTE — Telephone Encounter (Signed)
Notified pt new Rx was sent to pharmacy.

## 2012-08-24 NOTE — Progress Notes (Signed)
Reviewed and agree.

## 2012-09-09 ENCOUNTER — Ambulatory Visit (INDEPENDENT_AMBULATORY_CARE_PROVIDER_SITE_OTHER): Payer: BC Managed Care – PPO | Admitting: Internal Medicine

## 2012-09-09 ENCOUNTER — Encounter: Payer: Self-pay | Admitting: Internal Medicine

## 2012-09-09 VITALS — BP 112/70 | HR 77 | Temp 98.0°F | Resp 16 | Ht 64.0 in | Wt 140.0 lb

## 2012-09-09 DIAGNOSIS — R059 Cough, unspecified: Secondary | ICD-10-CM

## 2012-09-09 DIAGNOSIS — R300 Dysuria: Secondary | ICD-10-CM

## 2012-09-09 DIAGNOSIS — N390 Urinary tract infection, site not specified: Secondary | ICD-10-CM

## 2012-09-09 DIAGNOSIS — J019 Acute sinusitis, unspecified: Secondary | ICD-10-CM

## 2012-09-09 MED ORDER — CIPROFLOXACIN HCL 500 MG PO TABS: 250.0000 mg | ORAL_TABLET | Freq: Two times a day (BID) | ORAL | Status: DC

## 2012-09-09 MED ORDER — HYDROCODONE-HOMATROPINE 5-1.5 MG/5ML PO SYRP: 5.0000 mL | ORAL_SOLUTION | Freq: Four times a day (QID) | ORAL | Status: DC | PRN

## 2012-09-09 MED ORDER — FLUOCINONIDE-E 0.05 % EX CREA: TOPICAL_CREAM | Freq: Two times a day (BID) | CUTANEOUS | Status: DC

## 2012-09-09 MED ORDER — AMOXICILLIN 500 MG PO CAPS: 1000.0000 mg | ORAL_CAPSULE | Freq: Two times a day (BID) | ORAL | Status: AC

## 2012-09-12 NOTE — Progress Notes (Signed)
  Subjective:    Patient ID: Rachael Acosta, female    DOB: [birth year], 47 y.o.   MRN: [id number]  HPI complaining of sinus problems again with drainage, headache, nocturnal cough, and morning time sore throat/no chills or fever/cough nonproductive see history of allergic rhinitis with episodic sinus infections Just back from a trip to [place] Her symptoms started after her trip/started with sneezing Continues with Flonase and was just changed to Zyrtec/also feels like Astelin helps    Review of Systems Recurrent urinary tract infections sometimes associated with intercourse which is infrequent Note recent visit with Dr. Ross Ludwig in current relationship that is long-term No current symptoms-GU      Objective:   Physical Exam Vital signs stable TMs clear Tender left maxillary area Purulent sinus discharge bilaterally Throat clear No nodes Mild eczema extremities       Assessment & Plan:  Sinusitis secondary to allergic rhinitis  recurrent UTIs Mild eczema  Meds ordered this encounter  Medications  . amoxicillin (AMOXIL) 500 MG capsule    Sig: Take 2 capsules (1,000 mg total) by mouth 2 (two) times daily.    Dispense:  40 capsule    Refill:  0  . ciprofloxacin (CIPRO) 500 MG tablet    Sig: Take 0.5 tablets (250 mg total) by mouth 2 (two) times daily. At onset of UTI    Dispense:  6 tablet    Refill:  3----to use when symptomatic while traveling   . fluocinonide-emollient (LIDEX-E) 0.05 % cream    Sig: Apply topically 2 (two) times daily.    Dispense:  60 g    Refill:  2  . HYDROcodone-homatropine (HYCODAN) 5-1.5 MG/5ML syrup    Sig: Take 5 mLs by mouth every 6 (six) hours as needed for cough.    Dispense:  120 mL    Refill:  0  Continue Astelin/Flonase/Zyrtec

## 2012-09-16 ENCOUNTER — Telehealth: Payer: Self-pay

## 2012-09-16 NOTE — Telephone Encounter (Signed)
Called in the hycodan she did not get this when she was here.

## 2012-09-16 NOTE — Telephone Encounter (Signed)
PT SAYS PHARMACY SENT OVER REFILL REQUEST FOR COUGH MEDICINE.  SAYS THEY HAVE NOT HEARD ANYTHING FROM [place].  SAYS SHE IS STILL COUGHING. SAW [facility].  [phone]

## 2012-10-02 ENCOUNTER — Telehealth: Payer: Self-pay

## 2012-10-02 DIAGNOSIS — M545 Low back pain, unspecified: Secondary | ICD-10-CM

## 2012-10-02 NOTE — Telephone Encounter (Signed)
Dr. Merla Riches : Patient is still having lower back pain on her left side. She would like to know if she needs to come in or can you refer her to a specialist. Her number is [phone]

## 2012-10-02 NOTE — Telephone Encounter (Signed)
Please get more info. At her last visit we have a history for sinusitis.

## 2012-10-04 NOTE — Telephone Encounter (Signed)
Spoke with pt, she did go to a specialist and it didn't help her. She just wants to try a different doctor. She states we tried to send her somewhere before but she was unable to go at the time. She wants to go now can we set up referral? Please advise. I also tried to tell her to come in for an office visit but she wanted me to ask you first.

## 2012-10-05 NOTE — Telephone Encounter (Signed)
Will refer to dr Althea Charon

## 2012-10-05 NOTE — Telephone Encounter (Signed)
Called her. Left message to advise Dr Althea Charon may be able to refer to another Dr in his practice. Left message for her to call back so I can advise.

## 2012-12-15 ENCOUNTER — Ambulatory Visit (INDEPENDENT_AMBULATORY_CARE_PROVIDER_SITE_OTHER): Payer: BC Managed Care – PPO | Admitting: Internal Medicine

## 2012-12-15 VITALS — BP 130/80 | HR 78 | Temp 98.3°F | Resp 16 | Ht 64.0 in | Wt 141.6 lb

## 2012-12-15 DIAGNOSIS — R42 Dizziness and giddiness: Secondary | ICD-10-CM

## 2012-12-15 DIAGNOSIS — J309 Allergic rhinitis, unspecified: Secondary | ICD-10-CM

## 2012-12-15 DIAGNOSIS — M549 Dorsalgia, unspecified: Secondary | ICD-10-CM

## 2012-12-15 MED ORDER — CARISOPRODOL 250 MG PO TABS: 250.0000 mg | ORAL_TABLET | Freq: Every day | ORAL | Status: DC

## 2012-12-15 MED ORDER — FLUTICASONE PROPIONATE 50 MCG/ACT NA SUSP: NASAL | Status: DC

## 2012-12-15 MED ORDER — PREDNISONE 20 MG PO TABS: ORAL_TABLET | ORAL | Status: DC

## 2012-12-15 NOTE — Progress Notes (Signed)
  Subjective:    Patient ID: Rachael Acosta, female    DOB: 24-Jun-1966, 47 y.o.   MRN: [id number]  HPI complaining of episodic dizziness as if seasick or with airplane sickness over the past 4-5 weeks Occasionally associated with nausea and fatigue Occasional left frontal headaches similar to her past sinus headaches No ear complaints No palpitations or shortness of breath Episodes may occur with sitting or activity Able to exercise hard 5 days a week without symptoms  Has had increased allergy symptoms this spring  Continues to have problems with low back pain but is trying to exercise her weight through this  Sleep issues-? Related to back pain/? Related to anxiety although things are much improved Daughter 19-recent loss of job  Review of Systems No further urinary tract symptoms    Objective:   Physical Exam BP 130/80  Pulse 78  Temp(Src) 98.3 F (36.8 C) (Oral)  Resp 16  Ht 5\' 4"  (1.626 m)  Wt 141 lb 9.6 oz (64.229 kg)  BMI 24.29 kg/m2  SpO2 100% TMs clear Pupils equal round reactive to light and accommodation/EOMs conjugate Oropharynx clear Nares slightly boggy No anterior cervical nodes/no thyromegaly Heart regular without murmur or click Lungs clear Cranial nerves II through XII intact Deep tendon reflexes symmetrical without clonus Romberg negative Gait normal       Assessment & Plan:  1)Back pain--soma 250 hs  2)Dizzy-vertigo/mild-?etio///watch effect of rx for #3  3)AR (allergic rhinitis)  4)HA-L fron  5)sleep disruption  Meds ordered this encounter  Medications  . carisoprodol (SOMA) 250 MG tablet    Sig: Take 1 tablet (250 mg total) by mouth at bedtime.    Dispense:  30 tablet    Refill:  5  . fluticasone (FLONASE) 50 MCG/ACT nasal spray    Sig: instill 1 spray into each nostril twice a day    Dispense:  16 g    Refill:  2  . predniSONE (DELTASONE) 20 MG tablet    Sig: 3/3/2/2/1/1 single daily dose for 6 days    Dispense:  12 tablet   Refill:  0   F/u 1 mo CPE Call sooner if worse

## 2013-02-03 ENCOUNTER — Ambulatory Visit (INDEPENDENT_AMBULATORY_CARE_PROVIDER_SITE_OTHER): Payer: BC Managed Care – PPO | Admitting: Internal Medicine

## 2013-02-03 ENCOUNTER — Encounter: Payer: Self-pay | Admitting: Internal Medicine

## 2013-02-03 VITALS — BP 108/72 | HR 64 | Temp 98.9°F | Resp 16 | Ht 63.5 in | Wt 141.8 lb

## 2013-02-03 DIAGNOSIS — R82998 Other abnormal findings in urine: Secondary | ICD-10-CM

## 2013-02-03 DIAGNOSIS — R8281 Pyuria: Secondary | ICD-10-CM

## 2013-02-03 DIAGNOSIS — M549 Dorsalgia, unspecified: Secondary | ICD-10-CM

## 2013-02-03 DIAGNOSIS — R6882 Decreased libido: Secondary | ICD-10-CM

## 2013-02-03 DIAGNOSIS — Z0000 Encounter for general adult medical examination without abnormal findings: Secondary | ICD-10-CM

## 2013-02-03 LAB — CBC
Hemoglobin: 14 g/dL (ref 12.0–15.0)
MCH: 31.1 pg (ref 26.0–34.0)
MCHC: 33.5 g/dL (ref 30.0–36.0)
MCV: 92.9 fL (ref 78.0–100.0)
RBC: 4.5 MIL/uL (ref 3.87–5.11)

## 2013-02-03 LAB — POCT UA - MICROSCOPIC ONLY
Casts, Ur, LPF, POC: NEGATIVE
Mucus, UA: POSITIVE

## 2013-02-03 LAB — POCT WET PREP WITH KOH
KOH Prep POC: NEGATIVE
Trichomonas, UA: NEGATIVE
Yeast Wet Prep HPF POC: NEGATIVE

## 2013-02-03 LAB — POCT URINALYSIS DIPSTICK
Bilirubin, UA: NEGATIVE
Blood, UA: NEGATIVE
Glucose, UA: NEGATIVE
Ketones, UA: NEGATIVE
Nitrite, UA: NEGATIVE
pH, UA: 6.5

## 2013-02-03 NOTE — Progress Notes (Signed)
  Subjective:    Patient ID: Rachael Acosta, female    DOB: [birth year], 47 y.o.   MRN: [id number]  HPI    Review of Systems     Objective:   Physical Exam  Genitourinary: Vagina normal and uterus normal. Pelvic exam was performed with patient supine. There is labial fusion. There is no rash, tenderness, lesion or injury on the right labia. There is no rash, tenderness, lesion or injury on the left labia. Cervix exhibits friability (slightly with pap). Cervix exhibits no motion tenderness and no discharge. Right adnexum displays no mass, no tenderness and no fullness. Left adnexum displays no mass, no tenderness and no fullness.          Assessment & Plan:

## 2013-02-03 NOTE — Progress Notes (Signed)
  Subjective:    Patient ID: Rachael Acosta, female    DOB: 1966/05/05, 47 y.o.   MRN: [id number]  HPI    Review of Systems  Constitutional: Negative.   HENT: Positive for sinus pressure.   Eyes: Negative.   Respiratory: Negative.   Cardiovascular: Negative.   Gastrointestinal: Negative.   Endocrine: Negative.   Genitourinary: Positive for urgency.  Musculoskeletal: Positive for back pain.  Skin: Negative.   Allergic/Immunologic: Negative.   Neurological: Negative.   Hematological: Negative.   Psychiatric/Behavioral: Negative.        Objective:   Physical Exam        Assessment & Plan:

## 2013-02-03 NOTE — Progress Notes (Signed)
  Subjective:    Patient ID: Rachael Acosta, female    DOB: 1965-07-28, 47 y.o.   MRN: [id number]  HPIcpe Doing well except continued lumbar pain on daily basis. She exercises daily including weight lifting and elliptical trainer. Her back hurts throughout her workout. She is stiff in the morning but is able to sleep fairly well. Muscle relaxers help but do not resolve the problem. She spent several months in therapy with [name] without complete remission. Her x-rays were considered normal prior to this referral. The pain is primarily on the left side of the lumbar area radiating into the left posterior thigh occasionally but without weakness or numbness. No bladder or bowel problems. At one point this was considered associated with left lower quadrant pain by GYN evaluation including ultrasound was normal and there were no GI problems found. She also has had a chronic tightness and pain in the left upper back for many years.   She is currently without a steady relationship and has several questions about a diminished libido. This has been present as long as she can remember. Conservative Asian upbringing with first experience age 24 when married-arranged. This relationship has ended but she still has little interest and wonders why. She admits an anxious personality which prevents her from being very open to new settings. She likes remaining control. She has mild dysthymia. She goes out of her way to please. She had recurrent urinary tract infections when sexually active. Menses regular Last Pap 2011 History of her recent breast lump evaluated one year ago and felt to be normal/he is status post augmentation 2003 2 children she cares for as single parent  Review of Systems Review of systems otherwise negative except she continues to notice occasional urinary urgency during the day without nocturia      Objective:   Physical Exam BP 108/72  Pulse 64  Temp(Src) 98.9 F (37.2 C) (Oral)  Resp  16  Ht 5' 3.5" (1.613 m)  Wt 141 lb 12.8 oz (64.32 kg)  BMI 24.72 kg/m2  SpO2 100%  LMP 01/12/2013 Skin clear  HEENT clear  Heart regular without murmur or click  Lungs clear  R breast With 1 cm freely movable slightly tender nodule in the lower outer quadrant that has been evaluated and is stable /remainder the breast exam bilaterally is normal with intact implants  L parascap border  is tender to palpation and tight  Abdomen soft nontender nondistended with no organomegaly or masses Pelvic exam by PAC Weber L LS area tender to palpation caudally along the paraspinous muscle/negative sciatic notch/negative straight leg raise Deep tendon reflexes symmetrical     Assessment & Plan:  cpe Routine general medical examination at a health care facility - Back pain-chronic L lumbar -this has reached the point where she needs an answer to the duration of her problem/we need to rule out any condition that would also cause urinary urgency or intermittent left lower quadrant pain. Plan: MR Lumbar Spine Wo Contrast Decreased libido-she would be interested in sex therapy Pyuria-slight--- urine culture

## 2013-02-04 LAB — COMPREHENSIVE METABOLIC PANEL
ALT: 11 U/L (ref 0–35)
AST: 17 U/L (ref 0–37)
Albumin: 4.1 g/dL (ref 3.5–5.2)
Alkaline Phosphatase: 43 U/L (ref 39–117)
Glucose, Bld: 75 mg/dL (ref 70–99)
Potassium: 4.1 mEq/L (ref 3.5–5.3)
Sodium: 140 mEq/L (ref 135–145)
Total Bilirubin: 0.6 mg/dL (ref 0.3–1.2)
Total Protein: 6.9 g/dL (ref 6.0–8.3)

## 2013-02-04 LAB — LIPID PANEL
LDL Cholesterol: 94 mg/dL (ref 0–99)
Triglycerides: 58 mg/dL (ref <150)
VLDL: 12 mg/dL (ref 0–40)

## 2013-02-04 LAB — URINE CULTURE: Organism ID, Bacteria: NO GROWTH

## 2013-02-04 LAB — TSH: TSH: 3.374 u[IU]/mL (ref 0.350–4.500)

## 2013-02-05 LAB — PAP IG, CT-NG, RFX HPV ASCU

## 2013-02-09 ENCOUNTER — Encounter: Payer: Self-pay | Admitting: Internal Medicine

## 2013-02-10 ENCOUNTER — Telehealth: Payer: Self-pay

## 2013-02-10 MED ORDER — METRONIDAZOLE 500 MG PO TABS: 500.0000 mg | ORAL_TABLET | Freq: Two times a day (BID) | ORAL | Status: DC

## 2013-02-10 NOTE — Telephone Encounter (Signed)
Per Dr. Merla Riches,  Call in Flagyl for pt

## 2013-02-25 ENCOUNTER — Telehealth: Payer: Self-pay | Admitting: Radiology

## 2013-02-25 ENCOUNTER — Other Ambulatory Visit: Payer: Self-pay | Admitting: Internal Medicine

## 2013-02-25 DIAGNOSIS — M545 Low back pain, unspecified: Secondary | ICD-10-CM

## 2013-02-25 DIAGNOSIS — G8929 Other chronic pain: Secondary | ICD-10-CM

## 2013-02-25 NOTE — Telephone Encounter (Signed)
Insurance company had denied MRI scan patient has been referred to Ortho. I have called her to advise.

## 2013-03-02 ENCOUNTER — Telehealth: Payer: Self-pay

## 2013-03-02 NOTE — Telephone Encounter (Signed)
PT NEED TO COME BY AND P/U A COPY OF HER XRAYS. PLEASE CALL [phone] WHEN READY

## 2013-03-29 ENCOUNTER — Other Ambulatory Visit: Payer: Self-pay | Admitting: Orthopedic Surgery

## 2013-03-29 DIAGNOSIS — M545 Low back pain, unspecified: Secondary | ICD-10-CM

## 2013-04-06 ENCOUNTER — Telehealth: Payer: Self-pay

## 2013-04-06 MED ORDER — HYDROCODONE-ACETAMINOPHEN 5-325 MG PO TABS: 1.0000 | ORAL_TABLET | Freq: Three times a day (TID) | ORAL | Status: DC | PRN

## 2013-04-06 NOTE — Telephone Encounter (Signed)
    Meds ordered this encounter  Medications  . HYDROcodone-acetaminophen (NORCO/VICODIN) 5-325 MG per tablet    Sig: Take 1 tablet by mouth every 8 (eight) hours as needed for pain.    Dispense:  15 tablet    Refill:  0

## 2013-04-06 NOTE — Telephone Encounter (Signed)
PT WOULD LIKE TO HAVE SOME HYDROCODONE CALLED IN FOR HER BACK. PLEASE CALL [phone]   [facility] ON [address]

## 2013-04-13 ENCOUNTER — Other Ambulatory Visit: Payer: Self-pay | Admitting: Internal Medicine

## 2013-04-27 ENCOUNTER — Ambulatory Visit (INDEPENDENT_AMBULATORY_CARE_PROVIDER_SITE_OTHER): Payer: BC Managed Care – PPO | Admitting: Internal Medicine

## 2013-04-27 ENCOUNTER — Telehealth: Payer: Self-pay

## 2013-04-27 VITALS — BP 122/70 | HR 64 | Temp 97.8°F | Resp 18 | Ht 64.0 in | Wt 138.0 lb

## 2013-04-27 DIAGNOSIS — R350 Frequency of micturition: Secondary | ICD-10-CM

## 2013-04-27 DIAGNOSIS — R309 Painful micturition, unspecified: Secondary | ICD-10-CM

## 2013-04-27 DIAGNOSIS — R82998 Other abnormal findings in urine: Secondary | ICD-10-CM

## 2013-04-27 DIAGNOSIS — R8281 Pyuria: Secondary | ICD-10-CM

## 2013-04-27 DIAGNOSIS — G4700 Insomnia, unspecified: Secondary | ICD-10-CM

## 2013-04-27 DIAGNOSIS — N23 Unspecified renal colic: Secondary | ICD-10-CM

## 2013-04-27 DIAGNOSIS — IMO0001 Reserved for inherently not codable concepts without codable children: Secondary | ICD-10-CM

## 2013-04-27 DIAGNOSIS — M791 Myalgia, unspecified site: Secondary | ICD-10-CM

## 2013-04-27 LAB — POCT URINALYSIS DIPSTICK
Bilirubin, UA: NEGATIVE
Glucose, UA: NEGATIVE
Ketones, UA: NEGATIVE
Nitrite, UA: NEGATIVE
Protein, UA: NEGATIVE
Spec Grav, UA: 1.015
Urobilinogen, UA: 0.2
pH, UA: 6

## 2013-04-27 LAB — POCT UA - MICROSCOPIC ONLY
Casts, Ur, LPF, POC: NEGATIVE
Crystals, Ur, HPF, POC: NEGATIVE
RBC, urine, microscopic: NEGATIVE
Yeast, UA: NEGATIVE

## 2013-04-27 MED ORDER — SULFAMETHOXAZOLE-TMP DS 800-160 MG PO TABS: 1.0000 | ORAL_TABLET | Freq: Two times a day (BID) | ORAL | Status: DC

## 2013-04-27 MED ORDER — FLUTICASONE PROPIONATE 50 MCG/ACT NA SUSP: NASAL | Status: DC

## 2013-04-27 MED ORDER — FLUOCINONIDE-E 0.05 % EX CREA: TOPICAL_CREAM | Freq: Two times a day (BID) | CUTANEOUS | Status: DC

## 2013-04-27 MED ORDER — ZOLPIDEM TARTRATE 10 MG PO TABS: 5.0000 mg | ORAL_TABLET | Freq: Every evening | ORAL | Status: DC | PRN

## 2013-04-27 NOTE — Telephone Encounter (Signed)
Pt states that she was seen today and would like to add a std test to her labs. [phone]

## 2013-04-27 NOTE — Telephone Encounter (Signed)
Can we add uriprobe???-I doubt it ,but do if possible(she said no in exam room) If can't then tell her we will collect early am 30cc specimen if urine cult is negative(could do lab only visit)

## 2013-04-27 NOTE — Progress Notes (Signed)
This chart was scribed for Rachael Sia, MD by Joaquin Music, ED Scribe. This patient was seen in room Room 13 and the patient's care was started at 1:11 PM   Subjective:    Patient ID: Rachael Acosta, female    DOB: 12/24/1965, 47 y.o.   MRN: [id number]  HPI Rachael Acosta is a 47 y.o. female who presents to the [facility] complaining of ongoing UTI. Pt states she has pressure, frequency, and burning from urination. Pt is currently taking Cipro for the past 2 days and she states she has slight relief with her burning sensation after taking medication. Pt has cough, chills, nausea, lower back pain, and body aches. She states she has been having sinus pressure and facial pain. Pt states she has tenderness to palpation in lower back area near kidneys. Pt is concerned for possibly having the flu. She has been around her co-worker whom has recently had the flu. Pt states she was recently seen at another Orthopedis and had an MRI was refused by insurance again. X-Ray findings were Arthritis in lower back. Pt denies having any new sexual partners. Pt denies possible STD.   Pt also complains of a chest rash onset 1 week. Pt states it has been warm to the touch and red. Pt denies wearing any new jewelry. Pt is applying cream to chest area. She does not know the name of the medication she has been applying to the area but she states she received the cream from a co-worker.   Pt also requested prescription refills for Loratadine, Flonase, Soma, and her sleeping medication.  Review of Systems  Constitutional: Positive for chills.  Respiratory: Positive for cough.   Skin: Positive for rash.       Objective:   Physical Exam  Nursing note and vitals reviewed. Constitutional: She is oriented to person, place, and time. She appears well-developed and well-nourished. No distress.  HENT:  Head: Normocephalic and atraumatic.  Eyes: EOM are normal. Pupils are equal, round, and reactive to light.  Neck:  Neck supple. No thyromegaly present.  Cardiovascular: Normal rate, regular rhythm and normal heart sounds.   Pulmonary/Chest: Effort normal and breath sounds normal. No respiratory distress.  Abdominal: She exhibits no mass. There is tenderness. There is no rebound and no guarding.  Has bilateral lower quadrant tenderness, mild.   Musculoskeletal: Normal range of motion.  Lymphadenopathy:    She has no cervical adenopathy.  Neurological: She is alert and oriented to person, place, and time. She has normal reflexes.  Skin: Skin is warm and dry. Rash noted.  Has a fine red palpable rash on anterior chest, above bra line.  Psychiatric: She has a normal mood and affect. Her behavior is normal.     Results for orders placed in visit on 04/27/13  POCT UA - MICROSCOPIC ONLY      Result Value Range   WBC, Ur, HPF, POC TNTC     RBC, urine, microscopic neg     Bacteria, U Microscopic 1+     Mucus, UA trace     Epithelial cells, urine per micros TNTC     Crystals, Ur, HPF, POC neg     Casts, Ur, LPF, POC neg     Yeast, UA neg    POCT URINALYSIS DIPSTICK      Result Value Range   Color, UA yellow     Clarity, UA clear     Glucose, UA neg     Bilirubin, UA neg  Ketones, UA neg     Spec Grav, UA 1.015     Blood, UA trace     pH, UA 6.0     Protein, UA neg     Urobilinogen, UA 0.2     Nitrite, UA neg     Leukocytes, UA Trace         Assessment & Plan:  The primary encounter diagnosis was Pain with urination. Diagnoses of Pyuria, Myalgia, Urinary frequency, and Insomnia were also pertinent to this visit.  Meds ordered this encounter  Medications  . fluticasone (FLONASE) 50 MCG/ACT nasal spray    Sig: instill 1 spray into each nostril twice a day    Dispense:  16 g    Refill:  8  . zolpidem (AMBIEN) 10 MG tablet    Sig: Take 0.5 tablets (5 mg total) by mouth at bedtime as needed for sleep.    Dispense:  15 tablet    Refill:  5  . sulfamethoxazole-trimethoprim (BACTRIM DS)  800-160 MG per tablet    Sig: Take 1 tablet by mouth 2 (two) times daily.    Dispense:  20 tablet    Refill:  0  . fluocinonide-emollient (LIDEX-E) 0.05 % cream    Sig: Apply topically 2 (two) times daily.    Dispense:  30 g    Refill:  0     I have completed the patient encounter in its entirety as documented by the scribe, with editing by me where necessary. Robert P. Merla Riches, M.D.

## 2013-04-28 ENCOUNTER — Telehealth: Payer: Self-pay

## 2013-04-28 NOTE — Telephone Encounter (Signed)
Yes, she can come in for Uriprobe. Called her, but number given is business number. No answer.

## 2013-04-28 NOTE — Telephone Encounter (Signed)
Can we refill for pt? Please advise

## 2013-04-28 NOTE — Telephone Encounter (Addendum)
PT STATES SHE WAS SEEN YESTERDAY AND GIVEN A LIST OF THE MEDICINES SHE IS ON, AND THE HYDROCODONE WAS ON IT, BUT SHE WAS NEVER GIVEN IT, HAD REQUESTED OVER 2 WEEKS AGO FOR HER PAIN. PLEASE CALL [phone]    [facility] ON [address]

## 2013-04-29 MED ORDER — HYDROCODONE-ACETAMINOPHEN 5-325 MG PO TABS: 1.0000 | ORAL_TABLET | Freq: Three times a day (TID) | ORAL | Status: DC | PRN

## 2013-04-29 NOTE — Telephone Encounter (Signed)
Also there is a request for hydrocodone for her, when it is ready will advise her of need for return to clinic for [facility] probe.

## 2013-04-29 NOTE — Telephone Encounter (Signed)
Have someone write a rx for hydrocodone for her back pain just like the last one she wrote...whichever PA is on duty

## 2013-04-29 NOTE — Telephone Encounter (Signed)
Pended the medication, please advise.

## 2013-04-29 NOTE — Telephone Encounter (Signed)
Also see other message,  I need to advise patient to RTC for Uriprobe collection.

## 2013-04-29 NOTE — Telephone Encounter (Signed)
Rx printed.  Meds ordered this encounter  Medications  . HYDROcodone-acetaminophen (NORCO/VICODIN) 5-325 MG per tablet    Sig: Take 1 tablet by mouth every 8 (eight) hours as needed for pain.    Dispense:  15 tablet    Refill:  0

## 2013-04-30 LAB — URINE CULTURE: Colony Count: 50000

## 2013-05-04 ENCOUNTER — Encounter: Payer: Self-pay | Admitting: Internal Medicine

## 2013-06-09 ENCOUNTER — Ambulatory Visit: Payer: BC Managed Care – PPO | Admitting: Internal Medicine

## 2013-06-24 ENCOUNTER — Ambulatory Visit: Payer: BC Managed Care – PPO

## 2013-06-24 ENCOUNTER — Ambulatory Visit (INDEPENDENT_AMBULATORY_CARE_PROVIDER_SITE_OTHER): Payer: BC Managed Care – PPO | Admitting: Internal Medicine

## 2013-06-24 VITALS — BP 104/74 | HR 68 | Temp 98.7°F | Resp 16 | Ht 64.25 in | Wt 139.6 lb

## 2013-06-24 DIAGNOSIS — M5441 Lumbago with sciatica, right side: Secondary | ICD-10-CM

## 2013-06-24 DIAGNOSIS — M5430 Sciatica, unspecified side: Secondary | ICD-10-CM

## 2013-06-24 DIAGNOSIS — N390 Urinary tract infection, site not specified: Secondary | ICD-10-CM

## 2013-06-24 LAB — POCT URINALYSIS DIPSTICK
Bilirubin, UA: NEGATIVE
Glucose, UA: NEGATIVE
Ketones, UA: NEGATIVE
Nitrite, UA: NEGATIVE
Urobilinogen, UA: 0.2

## 2013-06-24 LAB — POCT UA - MICROSCOPIC ONLY
Casts, Ur, LPF, POC: NEGATIVE
Mucus, UA: NEGATIVE
Yeast, UA: NEGATIVE

## 2013-06-24 MED ORDER — METHOCARBAMOL 750 MG PO TABS: 750.0000 mg | ORAL_TABLET | Freq: Four times a day (QID) | ORAL | Status: DC

## 2013-06-24 MED ORDER — METHYLPREDNISOLONE ACETATE 80 MG/ML IJ SUSP
80.0000 mg | Freq: Once | INTRAMUSCULAR | Status: AC
  Administered 2013-06-24: 80 mg via INTRAMUSCULAR

## 2013-06-24 MED ORDER — CIPROFLOXACIN HCL 500 MG PO TABS: 500.0000 mg | ORAL_TABLET | Freq: Two times a day (BID) | ORAL | Status: DC

## 2013-06-24 MED ORDER — PREDNISONE 10 MG PO TABS: ORAL_TABLET | ORAL | Status: DC

## 2013-06-24 NOTE — Progress Notes (Signed)
   Subjective:    Patient ID: Rachael Acosta, female    DOB: 09-11-1965, 47 y.o.   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 This has been going on for 3 months. She has tried Aleve but it doesn't help. She states that it burns and feel tingling when the pain hit her in her lower back and run from her right hip down to her right leg to her feet. She has never in juried her back before. Had same problem on left in past. No recent xr and did not do ordered MRI 9/24 by dr. Merla Riches.    Review of Systems neg    Objective:   Physical Exam  Vitals reviewed. Constitutional: She is oriented to person, place, and time. She appears well-developed and well-nourished.  Eyes: EOM are normal. Pupils are equal, round, and reactive to light.  Neck: Normal range of motion.  Cardiovascular: Normal rate.   Pulmonary/Chest: Effort normal and breath sounds normal.  Abdominal: Soft. Bowel sounds are normal. She exhibits no distension. There is no tenderness. There is no guarding.  Musculoskeletal: Normal range of motion. She exhibits tenderness.       Lumbar back: She exhibits tenderness, bony tenderness, pain and spasm. She exhibits normal range of motion, no swelling, no edema, no deformity, no laceration and normal pulse.  Neurological: She is alert and oriented to person, place, and time. She has normal strength and normal reflexes. No cranial nerve deficit or sensory deficit. She displays a negative Romberg sign.    Results for orders placed in visit on 06/24/13  POCT UA - MICROSCOPIC ONLY      Result Value Range   WBC, Ur, HPF, POC 5-10     RBC, urine, microscopic 0-3     Bacteria, U Microscopic moderate     Mucus, UA neg     Epithelial cells, urine per micros 3-5     Crystals, Ur, HPF, POC neg     Casts, Ur, LPF, POC neg     Yeast, UA neg    POCT URINALYSIS DIPSTICK      Result Value Range   Color, UA yellow     Clarity,  UA cloudy     Glucose, UA neg     Bilirubin, UA neg     Ketones, UA neg     Spec Grav, UA 1.015     Blood, UA moderate     pH, UA 7.5     Protein, UA neg     Urobilinogen, UA 0.2     Nitrite, UA neg     Leukocytes, UA small (1+)      [facility] reading (PRIMARY) by  Dr.Aerilyn Slee normal spine xr       Assessment & Plan:  UTI LB strain with right disc signs Cipro 7d Prednisone 6d taper/Robaxin 750

## 2013-06-24 NOTE — Patient Instructions (Signed)
Urinary Tract Infection  Urinary tract infections (UTIs) can develop anywhere along your urinary tract. Your urinary tract is your body's drainage system for removing wastes and extra water. Your urinary tract includes two kidneys, two ureters, a bladder, and a urethra. Your kidneys are a pair of bean-shaped organs. Each kidney is about the size of your fist. They are located below your ribs, one on each side of your spine.  CAUSES  Infections are caused by microbes, which are microscopic organisms, including fungi, viruses, and bacteria. These organisms are so small that they can only be seen through a microscope. Bacteria are the microbes that most commonly cause UTIs.  SYMPTOMS   Symptoms of UTIs may vary by age and gender of the patient and by the location of the infection. Symptoms in young women typically include a frequent and intense urge to urinate and a painful, burning feeling in the bladder or urethra during urination. Older women and men are more likely to be tired, shaky, and weak and have muscle aches and abdominal pain. A fever may mean the infection is in your kidneys. Other symptoms of a kidney infection include pain in your back or sides below the ribs, nausea, and vomiting.  DIAGNOSIS  To diagnose a UTI, your caregiver will ask you about your symptoms. Your caregiver also will ask to provide a urine sample. The urine sample will be tested for bacteria and white blood cells. White blood cells are made by your body to help fight infection.  TREATMENT   Typically, UTIs can be treated with medication. Because most UTIs are caused by a bacterial infection, they usually can be treated with the use of antibiotics. The choice of antibiotic and length of treatment depend on your symptoms and the type of bacteria causing your infection.  HOME CARE INSTRUCTIONS   If you were prescribed antibiotics, take them exactly as your caregiver instructs you. Finish the medication even if you feel better after you  have only taken some of the medication.   Drink enough water and fluids to keep your urine clear or pale yellow.   Avoid caffeine, tea, and carbonated beverages. They tend to irritate your bladder.   Empty your bladder often. Avoid holding urine for long periods of time.   Empty your bladder before and after sexual intercourse.   After a bowel movement, women should cleanse from front to back. Use each tissue only once.  SEEK MEDICAL CARE IF:    You have back pain.   You develop a fever.   Your symptoms do not begin to resolve within 3 days.  SEEK IMMEDIATE MEDICAL CARE IF:    You have severe back pain or lower abdominal pain.   You develop chills.   You have nausea or vomiting.   You have continued burning or discomfort with urination.  MAKE SURE YOU:    Understand these instructions.   Will watch your condition.   Will get help right away if you are not doing well or get worse.  Document Released: 04/10/2005 Document Revised: 12/31/2011 Document Reviewed: 08/09/2011  Exit[employer] Patient Information 2014 [employer].

## 2013-06-26 LAB — URINE CULTURE: Colony Count: 30000

## 2013-06-28 ENCOUNTER — Telehealth: Payer: Self-pay

## 2013-06-28 NOTE — Telephone Encounter (Signed)
The prednisone could keep her awake. Advised her not to take the Ambien at the same time she takes the muscle relaxer.  Advised also okay to d/c the muscle relaxer when she feels better, but she should complete prednisone course

## 2013-06-28 NOTE — Telephone Encounter (Signed)
PT WAS GIVEN MEDICINE TO HELP HER SLEEP AND IT ISN'T WORKING, SHE IS TAKEN A LOT OF OTHER MEDICINE AND WANT TO MAKE SURE IT ISN'T INTERFERING WITH HER SLEEP MEDS PLEASE CALL [phone]

## 2013-07-04 ENCOUNTER — Ambulatory Visit (INDEPENDENT_AMBULATORY_CARE_PROVIDER_SITE_OTHER): Payer: BC Managed Care – PPO | Admitting: Family Medicine

## 2013-07-04 ENCOUNTER — Encounter: Payer: Self-pay | Admitting: Family Medicine

## 2013-07-04 VITALS — BP 112/82 | HR 70 | Temp 98.1°F | Resp 16 | Ht 64.0 in | Wt 139.0 lb

## 2013-07-04 DIAGNOSIS — G4700 Insomnia, unspecified: Secondary | ICD-10-CM

## 2013-07-04 DIAGNOSIS — R0789 Other chest pain: Secondary | ICD-10-CM

## 2013-07-04 DIAGNOSIS — R002 Palpitations: Secondary | ICD-10-CM

## 2013-07-04 DIAGNOSIS — D7589 Other specified diseases of blood and blood-forming organs: Secondary | ICD-10-CM

## 2013-07-04 DIAGNOSIS — R110 Nausea: Secondary | ICD-10-CM

## 2013-07-04 LAB — POCT CBC
Granulocyte percent: 65.3 %G (ref 37–80)
HCT, POC: 43.2 % (ref 37.7–47.9)
Hemoglobin: 13.3 g/dL (ref 12.2–16.2)
Lymph, poc: 2.6 (ref 0.6–3.4)
MCH, POC: 31.2 pg (ref 27–31.2)
MCHC: 30.8 g/dL — AB (ref 31.8–35.4)
MCV: 101.4 fL — AB (ref 80–97)
MID (cbc): 0.4 (ref 0–0.9)
MPV: 7.9 fL (ref 0–99.8)
POC Granulocyte: 5.7 (ref 2–6.9)
POC LYMPH PERCENT: 29.7 %L (ref 10–50)
POC MID %: 5 %M (ref 0–12)
Platelet Count, POC: 229 10*3/uL (ref 142–424)
RBC: 4.26 M/uL (ref 4.04–5.48)
RDW, POC: 13.7 %
WBC: 8.8 10*3/uL (ref 4.6–10.2)

## 2013-07-04 LAB — POCT URINALYSIS DIPSTICK
Bilirubin, UA: NEGATIVE
Blood, UA: NEGATIVE
Glucose, UA: NEGATIVE
Ketones, UA: NEGATIVE
Leukocytes, UA: NEGATIVE
Nitrite, UA: NEGATIVE
Protein, UA: NEGATIVE
Spec Grav, UA: 1.015
Urobilinogen, UA: 0.2
pH, UA: 7

## 2013-07-04 MED ORDER — ZOLPIDEM TARTRATE 10 MG PO TABS: 5.0000 mg | ORAL_TABLET | Freq: Every evening | ORAL | Status: DC | PRN

## 2013-07-04 NOTE — Progress Notes (Signed)
47 yo woman with recent low back pain and UTI.  She no longer has any urinary symptoms but she does have   -fatigue -nausea -rapid heart beat -tightness in the throat.  LMP:  3 wks ago  Still taking the Cipro.  Patient notes that she is exhausted from working multiple hours [employer]: 10 hours a day for the last 6 weeks  Objective:  NAD Alert and cooperative Chest:  Clear Neck: supple, no thyromegaly or adenopathy Heart: reg, 60 bpm, no murmur Skin: no rash   Results for orders placed in visit on 07/04/13  POCT CBC      Result Value Range   WBC 8.8  4.6 - 10.2 K/uL   Lymph, poc 2.6  0.6 - 3.4   POC LYMPH PERCENT 29.7  10 - 50 %L   MID (cbc) 0.4  0 - 0.9   POC MID % 5.0  0 - 12 %M   POC Granulocyte 5.7  2 - 6.9   Granulocyte percent 65.3  37 - 80 %G   RBC 4.26  4.04 - 5.48 M/uL   Hemoglobin 13.3  12.2 - 16.2 g/dL   HCT, POC 16.1  09.6 - 47.9 %   MCV 101.4 (*) 80 - 97 fL   MCH, POC 31.2  27 - 31.2 pg   MCHC 30.8 (*) 31.8 - 35.4 g/dL   RDW, POC 04.5     Platelet Count, POC 229  142 - 424 K/uL   MPV 7.9  0 - 99.8 fL  POCT URINALYSIS DIPSTICK      Result Value Range   Color, UA yellow     Clarity, UA clear     Glucose, UA neg     Bilirubin, UA neg     Ketones, UA neg     Spec Grav, UA 1.015     Blood, UA neg     pH, UA 7.0     Protein, UA neg     Urobilinogen, UA 0.2     Nitrite, UA neg     Leukocytes, UA Negative     Insomnia - Plan: zolpidem (AMBIEN) 10 MG tablet  Palpitations - Plan: Comprehensive metabolic panel, POCT CBC, POCT urinalysis dipstick, TSH  Nausea alone - Plan: Comprehensive metabolic panel, POCT CBC, POCT urinalysis dipstick, TSH  Tightness in chest - Plan: Comprehensive metabolic panel, POCT CBC, POCT urinalysis dipstick, TSH  Macrocytosis - Plan: Vitamin B12  Signed, Elvina Sidle, MD

## 2013-07-05 ENCOUNTER — Ambulatory Visit: Payer: BC Managed Care – PPO | Admitting: Internal Medicine

## 2013-07-05 ENCOUNTER — Telehealth: Payer: Self-pay

## 2013-07-05 LAB — COMPREHENSIVE METABOLIC PANEL
ALT: 15 U/L (ref 0–35)
AST: 21 U/L (ref 0–37)
Albumin: 4.1 g/dL (ref 3.5–5.2)
Alkaline Phosphatase: 45 U/L (ref 39–117)
BUN: 17 mg/dL (ref 6–23)
CO2: 26 mEq/L (ref 19–32)
Calcium: 9.2 mg/dL (ref 8.4–10.5)
Chloride: 103 mEq/L (ref 96–112)
Creat: 1.02 mg/dL (ref 0.50–1.10)
Glucose, Bld: 101 mg/dL — ABNORMAL HIGH (ref 70–99)
Potassium: 4.5 mEq/L (ref 3.5–5.3)
Sodium: 136 mEq/L (ref 135–145)
Total Bilirubin: 0.5 mg/dL (ref 0.3–1.2)
Total Protein: 7.1 g/dL (ref 6.0–8.3)

## 2013-07-05 LAB — VITAMIN B12: Vitamin B-12: 1192 pg/mL — ABNORMAL HIGH (ref 211–911)

## 2013-07-05 LAB — TSH: TSH: 1.311 u[IU]/mL (ref 0.350–4.500)

## 2013-07-05 NOTE — Telephone Encounter (Signed)
Pt calling to inquire about lab results. Best# [phone]

## 2013-07-06 NOTE — Telephone Encounter (Signed)
Lab has advised her.

## 2013-07-28 ENCOUNTER — Telehealth: Payer: Self-pay

## 2013-07-28 NOTE — Telephone Encounter (Signed)
Advised pt would need to come in for an OV. Pt asked for OTC recommendations for motion sickness. I advised pt of Seabands or Dramamine. Pt will RTC if these do not work for her.

## 2013-07-28 NOTE — Telephone Encounter (Signed)
Patient is going to [place] and wants a motion sickness . [employer] [facility] [place]  [phone]

## 2013-08-18 ENCOUNTER — Ambulatory Visit: Payer: BC Managed Care – PPO | Admitting: Family Medicine

## 2013-08-18 VITALS — BP 110/70 | HR 103 | Temp 99.4°F | Resp 16 | Ht 64.5 in | Wt 145.2 lb

## 2013-08-18 DIAGNOSIS — J029 Acute pharyngitis, unspecified: Secondary | ICD-10-CM

## 2013-08-18 DIAGNOSIS — R05 Cough: Secondary | ICD-10-CM

## 2013-08-18 DIAGNOSIS — R059 Cough, unspecified: Secondary | ICD-10-CM

## 2013-08-18 LAB — POCT CBC
Granulocyte percent: 84 %G — AB (ref 37–80)
HCT, POC: 38.3 % (ref 37.7–47.9)
HEMOGLOBIN: 11.7 g/dL — AB (ref 12.2–16.2)
Lymph, poc: 1.4 (ref 0.6–3.4)
MCH, POC: 31.1 pg (ref 27–31.2)
MCHC: 30.5 g/dL — AB (ref 31.8–35.4)
MCV: 101.9 fL — AB (ref 80–97)
MID (cbc): 0.6 (ref 0–0.9)
MPV: 8.1 fL (ref 0–99.8)
POC GRANULOCYTE: 10.8 — AB (ref 2–6.9)
POC LYMPH PERCENT: 11.1 %L (ref 10–50)
POC MID %: 4.9 %M (ref 0–12)
Platelet Count, POC: 208 10*3/uL (ref 142–424)
RBC: 3.76 M/uL — AB (ref 4.04–5.48)
RDW, POC: 12.8 %
WBC: 12.9 10*3/uL — AB (ref 4.6–10.2)

## 2013-08-18 LAB — POCT RAPID STREP A (OFFICE): RAPID STREP A SCREEN: NEGATIVE

## 2013-08-18 MED ORDER — HYDROCODONE-HOMATROPINE 5-1.5 MG/5ML PO SYRP: 5.0000 mL | ORAL_SOLUTION | Freq: Four times a day (QID) | ORAL | Status: DC | PRN

## 2013-08-18 MED ORDER — AMOXICILLIN 500 MG PO CAPS: 1000.0000 mg | ORAL_CAPSULE | Freq: Two times a day (BID) | ORAL | Status: DC

## 2013-08-18 NOTE — Progress Notes (Addendum)
Subjective:   This chart was scribed for Ethelda ChickKristi M Bryn Saline, MD by Arlan OrganAshley Leger, [facility] Scribe. This patient was seen in room 10 and the patient's care was started 6:56 PM.     Patient ID: Rachael Acosta, female    DOB: 11/12/1965, 48 y.o.   MRN: [id number]  HPI  HPI Comments: Rachael Acosta is a 48 y.o. female who presents to [facility] complaining of possible sinusitis that initially started yesterday. She reports ongoing, moderate chills, rhinorrhea, HA, otalgia bilaterally, sore throat, and mild cough. She has tried Tylenol with no improvement at this time, and states she has not taken a dose today. She denies vomiting or diarrhea at this time. She denies any sick contacts. Denies any pertinent medical history, and has no other complaints this visit.  Review of Systems  Constitutional: Positive for fever, chills and fatigue. Negative for unexpected weight change.  HENT: Positive for ear pain, rhinorrhea, sinus pressure and sore throat. Negative for postnasal drip.   Eyes: Negative for redness.  Respiratory: Positive for cough. Negative for shortness of breath.   Gastrointestinal: Negative for nausea, vomiting and diarrhea.  Musculoskeletal: Negative for myalgias.  Neurological: Negative for weakness.  Psychiatric/Behavioral: The patient is not nervous/anxious.    Past Medical History  Diagnosis Date  . Allergy     Past Surgical History  Procedure Laterality Date  . Breast enhancement surgery    . Breast surgery    . Eye surgery      History   Social History  . Marital Status: Married    Spouse Name: N/A    Number of Children: N/A  . Years of Education: N/A   Occupational History  . Not on file.   Social History Main Topics  . Smoking status: Never Smoker   . Smokeless tobacco: Not on file  . Alcohol Use: 0.5 oz/week    1 drink(s) per week     Comment: occassionally  . Drug Use: No  . Sexual Activity: Yes   Other Topics Concern    . Not on file   Social History Narrative   2 pregnancines   2 live births    Triage Vitals: BP 110/70  Pulse 103  Temp(Src) 99.4 F (37.4 C) (Oral)  Resp 16  Ht 5' 4.5" (1.638 m)  Wt 145 lb 3.2 oz (65.862 kg)  BMI 24.55 kg/m2  SpO2 100%  LMP 08/15/2013   Objective:  Physical Exam  Nursing note and vitals reviewed. Constitutional: She is oriented to person, place, and time. She appears well-developed and well-nourished. No distress.  HENT:  Head: Normocephalic and atraumatic.  Right Ear: Hearing, tympanic membrane, external ear and ear canal normal.  Left Ear: Hearing, tympanic membrane, external ear and ear canal normal.  Mouth/Throat: Uvula is midline. Posterior oropharyngeal erythema present. No oropharyngeal exudate.  Eyes: EOM are normal.  Neck: Normal range of motion. Neck supple.  Cervical adenopathy left greater than right  Cardiovascular: Normal rate, regular rhythm and normal heart sounds.  Exam reveals no gallop and no friction rub.   No murmur heard. Pulmonary/Chest: Effort normal and breath sounds normal. No respiratory distress. She has no wheezes. She has no rales. She exhibits no tenderness.  Musculoskeletal: Normal range of motion.  Lymphadenopathy:    She has cervical adenopathy.  Neurological: She is alert and oriented to person, place, and time.  Skin: Skin is warm and dry. She is not diaphoretic.  Psychiatric: She has a  normal mood and affect. Her behavior is normal. Judgment and thought content normal.   Results for orders placed in visit on 08/18/13  POCT CBC      Result Value Range   WBC 12.9 (*) 4.6 - 10.2 K/uL   Lymph, poc 1.4  0.6 - 3.4   POC LYMPH PERCENT 11.1  10 - 50 %L   MID (cbc) 0.6  0 - 0.9   POC MID % 4.9  0 - 12 %M   POC Granulocyte 10.8 (*) 2 - 6.9   Granulocyte percent 84.0 (*) 37 - 80 %G   RBC 3.76 (*) 4.04 - 5.48 M/uL   Hemoglobin 11.7 (*) 12.2 - 16.2 g/dL   HCT, POC 16.1  09.6 - 47.9 %   MCV 101.9 (*) 80 - 97 fL   MCH, POC  31.1  27 - 31.2 pg   MCHC 30.5 (*) 31.8 - 35.4 g/dL   RDW, POC 04.5     Platelet Count, POC 208  142 - 424 K/uL   MPV 8.1  0 - 99.8 fL  POCT RAPID STREP A (OFFICE)      Result Value Range   Rapid Strep A Screen Negative  Negative   IBUPROFEN 600MG  PO ADMINISTERED IN OFFICE. Assessment & Plan:  Sore throat - Plan: POCT CBC, POCT rapid strep A  Cough - Plan: POCT CBC  1. Pharyngitis:  New.  Rx for Amoxicillin provided.  Recommend Tylenol and Motrin alternating for sore throat.  RTC for inability to swallow. 2. Cough:  New.  Mild; pt requesting Hycodan rx and provided.  Meds ordered this encounter  Medications  . amoxicillin (AMOXIL) 500 MG capsule    Sig: Take 2 capsules (1,000 mg total) by mouth 2 (two) times daily.    Dispense:  40 capsule    Refill:  0  . HYDROcodone-homatropine (HYCODAN) 5-1.5 MG/5ML syrup    Sig: Take 5 mLs by mouth every 6 (six) hours as needed for cough.    Dispense:  120 mL    Refill:  0   I personally performed the services described in this documentation, which was scribed in my presence.  The recorded information has been reviewed and is accurate.  Nilda Simmer, M.D.  [facility] [address] [place], [place]  [phone] phone [phone] fax

## 2013-08-18 NOTE — Patient Instructions (Signed)
Sore Throat A sore throat is pain, burning, irritation, or scratchiness of the throat. There is often pain or tenderness when swallowing or talking. A sore throat may be accompanied by other symptoms, such as coughing, sneezing, fever, and swollen neck glands. A sore throat is often the first sign of another sickness, such as a cold, flu, strep throat, or mononucleosis (commonly known as mono). Most sore throats go away without medical treatment. CAUSES  The most common causes of a sore throat include:  A viral infection, such as a cold, flu, or mono.  A bacterial infection, such as strep throat, tonsillitis, or whooping cough.  Seasonal allergies.  Dryness in the air.  Irritants, such as smoke or pollution.  Gastroesophageal reflux disease (GERD). HOME CARE INSTRUCTIONS   Only take over-the-counter medicines as directed by your caregiver.  Drink enough fluids to keep your urine clear or pale yellow.  Rest as needed.  Try using throat sprays, lozenges, or sucking on hard candy to ease any pain (if older than 4 years or as directed).  Sip warm liquids, such as broth, herbal tea, or warm water with honey to relieve pain temporarily. You may also eat or drink cold or frozen liquids such as frozen ice pops.  Gargle with salt water (mix 1 tsp salt with 8 oz of water).  Do not smoke and avoid secondhand smoke.  Put a cool-mist humidifier in your bedroom at night to moisten the air. You can also turn on a hot shower and sit in the bathroom with the door closed for 5 10 minutes. SEEK IMMEDIATE MEDICAL CARE IF:  You have difficulty breathing.  You are unable to swallow fluids, soft foods, or your saliva.  You have increased swelling in the throat.  Your sore throat does not get better in 7 days.  You have nausea and vomiting.  You have a fever or persistent symptoms for more than 2 3 days.  You have a fever and your symptoms suddenly get worse. MAKE SURE YOU:   Understand  these instructions.  Will watch your condition.  Will get help right away if you are not doing well or get worse. Document Released: 08/08/2004 Document Revised: 06/17/2012 Document Reviewed: 03/08/2012 [employer] Patient Information 2014 [employer].  

## 2013-10-04 ENCOUNTER — Ambulatory Visit (INDEPENDENT_AMBULATORY_CARE_PROVIDER_SITE_OTHER): Payer: BC Managed Care – PPO | Admitting: Internal Medicine

## 2013-10-04 VITALS — BP 111/76 | HR 67 | Temp 97.9°F | Resp 16 | Ht 64.0 in | Wt 142.6 lb

## 2013-10-04 DIAGNOSIS — M5430 Sciatica, unspecified side: Secondary | ICD-10-CM

## 2013-10-04 DIAGNOSIS — R6882 Decreased libido: Secondary | ICD-10-CM

## 2013-10-04 MED ORDER — FLUTICASONE PROPIONATE 50 MCG/ACT NA SUSP: NASAL | Status: DC

## 2013-10-04 MED ORDER — SILDENAFIL CITRATE 100 MG PO TABS: ORAL_TABLET | ORAL | Status: DC

## 2013-10-04 MED ORDER — CETIRIZINE HCL 10 MG PO TABS: 10.0000 mg | ORAL_TABLET | Freq: Every day | ORAL | Status: DC

## 2013-10-05 MED ORDER — MELOXICAM 15 MG PO TABS: 15.0000 mg | ORAL_TABLET | Freq: Every day | ORAL | Status: DC

## 2013-10-05 NOTE — Progress Notes (Signed)
   Subjective:    Patient ID: Rachael Acosta, female    DOB: 09/06/1965, 48 y.o.   MRN: [id number]  HPI Complaining of back pain for 8 weeks//this is different from the pain she had in the past which was on the other side and which has resolved eventually even though physical therapy did not seem to help She now has pain in a sciatic distribution on R with xrays neg 12/14--continues to be a daily problem. Continues to exercise despite pain --worse sitting and walking//no loss of sensation. She would like to return to the orthopedist physician assistant who helped her the last time  Also very concerned about a lengthy history of a decreased libido. New part/can't organnotverintr-longhxsame//Comes from culture where this was frowned upon nofric/plenlubrnatural 1st ic at [age] with new tyrannical husband--see past hx-never good  canmastoorgthotakawhi--obgyn never helpful-wants to try viagr after researching For many yrs poorselfesteem-now better brsurgled to decrnipsens////shy-bit of a loner   Review of Systems  Constitutional: Negative for unexpected weight change.  Gastrointestinal: Negative for abdominal pain, diarrhea and constipation.  Genitourinary: Negative for dysuria, urgency, frequency, vaginal bleeding, vaginal discharge, vaginal pain, menstrual problem, pelvic pain and dyspareunia.  Psychiatric/Behavioral: Negative for sleep disturbance and dysphoric mood. The patient is not nervous/anxious.    allergic symptoms are beginning and needs refills of medicine     Objective:   Physical Exam BP 111/76  Pulse 67  Temp(Src) 97.9 F (36.6 C) (Oral)  Resp 16  Ht 5\' 4"  (1.626 m)  Wt 142 lb 9.6 oz (64.683 kg)  BMI 24.47 kg/m2  SpO2 99%  LMP 10/04/2013 No thyromegaly or lymphadenopathy Heart regular without murmur Lungs clear Is tender over the right sciatic notch with pain along right posterior thigh with straight leg raise to 90 No distal motor or sensory losses      Assessment &  Plan:  Sciatica  Referral to orthopedics Decreased libido  Our bodies ourselves reading assignment  [employer] Allergic rhinitis Meds ordered this encounter  Medications  . sildenafil (VIAGRA) 100 MG tablet    Sig: 1/2 tab as directed no more than once a day    Dispense:  3 tablet    Refill:  0   trial if approved by insurance /in followup to discuss   . cetirizine (ZYRTEC) 10 MG tablet    Sig: Take 1 tablet (10 mg total) by mouth daily.    Dispense:  90 tablet    Refill:  3  . fluticasone (FLONASE) 50 MCG/ACT nasal spray    Sig: instill 1 spray into each nostril twice a day    Dispense:  16 g    Refill:  8   Back exercises Ref for ortho to consider injections//[facility] and would like to start with her again

## 2013-10-06 NOTE — Progress Notes (Signed)
Kim summers to schedule this appointment

## 2013-10-12 ENCOUNTER — Ambulatory Visit (INDEPENDENT_AMBULATORY_CARE_PROVIDER_SITE_OTHER): Payer: BC Managed Care – PPO | Admitting: Internal Medicine

## 2013-10-12 VITALS — BP 100/80 | HR 56 | Temp 97.3°F | Resp 16 | Ht 62.0 in | Wt 140.6 lb

## 2013-10-12 DIAGNOSIS — J019 Acute sinusitis, unspecified: Secondary | ICD-10-CM

## 2013-10-12 DIAGNOSIS — J301 Allergic rhinitis due to pollen: Secondary | ICD-10-CM

## 2013-10-12 MED ORDER — HYDROCODONE-HOMATROPINE 5-1.5 MG/5ML PO SYRP: 5.0000 mL | ORAL_SOLUTION | Freq: Four times a day (QID) | ORAL | Status: DC | PRN

## 2013-10-12 MED ORDER — PREDNISONE 20 MG PO TABS: ORAL_TABLET | ORAL | Status: DC

## 2013-10-12 MED ORDER — AMOXICILLIN 500 MG PO CAPS: 1000.0000 mg | ORAL_CAPSULE | Freq: Two times a day (BID) | ORAL | Status: AC

## 2013-10-12 NOTE — Progress Notes (Signed)
   Subjective:    Patient ID: Rachael Acosta, female    DOB: 10/10/1965, 48 y.o.   MRN: [id number]  HPI  Chief Complaint  Patient presents with  . Nasal Congestion    x1 week   . Cough    productive, brownish color  . Chills   two-week history of what she thought started his allergy symptoms which she has frequently Over the last 7-10 days she has had increased congestion with headache and sinus pressure and over the last week a cough that is productive of sputum especially in the morning She has had chills frequently over the last 24 hours and mild sore throat     Review of Systems Noncontributory    Objective:   Physical Exam BP 100/80  Pulse 56  Temp(Src) 97.3 F (36.3 C) (Oral)  Resp 16  Ht 5\' 2"  (1.575 m)  Wt 140 lb 9.6 oz (63.776 kg)  BMI 25.71 kg/m2  SpO2 97%  LMP 10/04/2013 Conjunctiva slightly injected TMs clear Nares with purulent mucus Tender maxillary areas Throat clear No nodes Chest clear       Assessment & Plan:  Acute sinusitis, unspecified  Allergic rhinitis due to pollen  Meds ordered this encounter  Medications  . amoxicillin (AMOXIL) 500 MG capsule    Sig: Take 2 capsules (1,000 mg total) by mouth 2 (two) times daily.    Dispense:  40 capsule    Refill:  0  . predniSONE (DELTASONE) 20 MG tablet    Sig: 3/3/2/2/1/1 single daily dose for 6 days    Dispense:  12 tablet    Refill:  0  . HYDROcodone-homatropine (HYCODAN) 5-1.5 MG/5ML syrup    Sig: Take 5 mLs by mouth every 6 (six) hours as needed for cough.    Dispense:  120 mL    Refill:  0   She is given the names of allergist for skin testing

## 2013-10-12 NOTE — Patient Instructions (Signed)
Sudafed 12hour to use breakfast and dinner

## 2013-10-15 NOTE — Progress Notes (Signed)
Appointment has been made and patient notified by voicemail.

## 2013-10-27 ENCOUNTER — Other Ambulatory Visit: Payer: Self-pay | Admitting: Obstetrics and Gynecology

## 2013-10-27 DIAGNOSIS — N6310 Unspecified lump in the right breast, unspecified quadrant: Secondary | ICD-10-CM

## 2013-11-03 ENCOUNTER — Encounter: Payer: Self-pay | Admitting: Internal Medicine

## 2013-11-03 ENCOUNTER — Ambulatory Visit (INDEPENDENT_AMBULATORY_CARE_PROVIDER_SITE_OTHER): Payer: BC Managed Care – PPO | Admitting: Internal Medicine

## 2013-11-03 VITALS — BP 108/74 | HR 63 | Temp 98.5°F | Resp 16 | Ht 64.0 in | Wt 142.2 lb

## 2013-11-03 DIAGNOSIS — J301 Allergic rhinitis due to pollen: Secondary | ICD-10-CM

## 2013-11-03 DIAGNOSIS — Z0000 Encounter for general adult medical examination without abnormal findings: Secondary | ICD-10-CM

## 2013-11-03 LAB — POCT UA - MICROSCOPIC ONLY
Amorphous: POSITIVE
Casts, Ur, LPF, POC: NEGATIVE
Crystals, Ur, HPF, POC: NEGATIVE
MUCUS UA: POSITIVE
Yeast, UA: NEGATIVE

## 2013-11-03 LAB — POCT URINALYSIS DIPSTICK
BILIRUBIN UA: NEGATIVE
Glucose, UA: NEGATIVE
KETONES UA: NEGATIVE
Nitrite, UA: NEGATIVE
Protein, UA: 30
Spec Grav, UA: 1.02
UROBILINOGEN UA: 0.2
pH, UA: 7.5

## 2013-11-03 LAB — CBC WITH DIFFERENTIAL/PLATELET
Basophils Absolute: 0 10*3/uL (ref 0.0–0.1)
Basophils Relative: 0 % (ref 0–1)
EOS PCT: 4 % (ref 0–5)
Eosinophils Absolute: 0.2 10*3/uL (ref 0.0–0.7)
HEMATOCRIT: 37.8 % (ref 36.0–46.0)
Hemoglobin: 13 g/dL (ref 12.0–15.0)
LYMPHS ABS: 1.4 10*3/uL (ref 0.7–4.0)
LYMPHS PCT: 30 % (ref 12–46)
MCH: 31 pg (ref 26.0–34.0)
MCHC: 34.4 g/dL (ref 30.0–36.0)
MCV: 90 fL (ref 78.0–100.0)
Monocytes Absolute: 0.3 10*3/uL (ref 0.1–1.0)
Monocytes Relative: 7 % (ref 3–12)
Neutro Abs: 2.7 10*3/uL (ref 1.7–7.7)
Neutrophils Relative %: 59 % (ref 43–77)
PLATELETS: 248 10*3/uL (ref 150–400)
RBC: 4.2 MIL/uL (ref 3.87–5.11)
RDW: 13.3 % (ref 11.5–15.5)
WBC: 4.6 10*3/uL (ref 4.0–10.5)

## 2013-11-03 LAB — COMPLETE METABOLIC PANEL WITH GFR
ALT: 13 U/L (ref 0–35)
AST: 20 U/L (ref 0–37)
Albumin: 3.9 g/dL (ref 3.5–5.2)
Alkaline Phosphatase: 44 U/L (ref 39–117)
BUN: 11 mg/dL (ref 6–23)
CALCIUM: 8.6 mg/dL (ref 8.4–10.5)
CHLORIDE: 103 meq/L (ref 96–112)
CO2: 26 meq/L (ref 19–32)
CREATININE: 0.93 mg/dL (ref 0.50–1.10)
GFR, EST AFRICAN AMERICAN: 83 mL/min
GFR, Est Non African American: 72 mL/min
GLUCOSE: 88 mg/dL (ref 70–99)
Potassium: 4.1 mEq/L (ref 3.5–5.3)
SODIUM: 138 meq/L (ref 135–145)
TOTAL PROTEIN: 6.9 g/dL (ref 6.0–8.3)
Total Bilirubin: 0.7 mg/dL (ref 0.2–1.2)

## 2013-11-03 LAB — LIPID PANEL
CHOLESTEROL: 169 mg/dL (ref 0–200)
HDL: 59 mg/dL (ref >39)
LDL Cholesterol: 91 mg/dL (ref 0–99)
Total CHOL/HDL Ratio: 2.9 Ratio
Triglycerides: 93 mg/dL (ref <150)
VLDL: 19 mg/dL (ref 0–40)

## 2013-11-03 NOTE — Progress Notes (Signed)
This chart was scribed for Tonye Pearsonobert P Mickenzie Stolar, MD by Ardelia Memsylan Malpass, ED Scribe. This patient was seen in room 25 and the patient's care was started at 10:46 AM.  Subjective:    Patient ID: Rachael Acosta, female    DOB: 10/01/1965, 48 y.o.   MRN: [id number]  HPI  HPI Comments: Rachael JewelsKimly Sauser is a 48 y.o. female who presents to [facility] for a complete physical examination.  She states that her main health concern currently is allergy symptoms. She states that her eyes feels "puffy" and that she has been having sinus pressure. She states that she has been using OTC eye drops with some relief. She states that she has not tried using any Flonase eye drops or other medications. She states that her insurance will not cover prescription forms of eye drops, and due to this, she is not interested in taking prescription medications. She also notes that she has been taking Zyrtec daily. She states that she is interested in following up wit a specialist for allergy testing.  She states that her sciatic nerve was bothering her yesterday, and that she was having lower back pain at this time. She states that she believes this was triggered by working and being on her feet all day yesterday. She also states that she exercised on a stair master for about an our yesterday. She states that she is not having any back pain today, however, she noticed some residual paresthesias in her right lower leg this morning. She states that she took Aleve with relief of her pain yesterday. She states that she has also tried Meloxicam for this pain, but that Aleve seems to work better.  She also states that she has had an intermittent "cramping" pain in her right calf recently. She states that she does not believe she injured her calf, but admits to strenuous exercise.  She states that she has been seen by her OB-GYN last week for a right breast nodule, and she was told that she will be referred to a specialist. She  reports that her OB-GYN had no other concerns for her health at this visit.  She reports that she is having very little stress this year, and that she feels like she is starting to enjoy her life.  Patient Active Problem List   Diagnosis Date Noted  . Back pain 02/03/2013  . AR (allergic rhinitis) 09/17/2011   Review of Systems  HENT: Positive for sinus pressure.   Eyes: Visual disturbance: "feels blurry", attributed to allergies.       Eye "feel puffy", attributed to allergies  Musculoskeletal: Positive for back pain (resolved) and myalgias (right calf).       Right breast nodule  All other systems reviewed and are negative.      Objective:   Physical Exam  Nursing note and vitals reviewed. Constitutional: She is oriented to person, place, and time. She appears well-developed and well-nourished. No distress.  HENT:  Head: Normocephalic and atraumatic.  Right Ear: External ear normal.  Left Ear: External ear normal.  Nose: Nose normal.  Mouth/Throat: Oropharynx is clear and moist. No oropharyngeal exudate.  She has allergic turbinates.  Eyes: Conjunctivae and EOM are normal. Pupils are equal, round, and reactive to light.  Neck: Neck supple. No thyromegaly present.  Cardiovascular: Normal rate, regular rhythm, normal heart sounds and intact distal pulses.   No murmur heard. Pulmonary/Chest: Effort normal and breath sounds normal. No respiratory distress.    Implants  Abdominal: Soft. Bowel sounds are normal. She exhibits no mass. There is no tenderness.  Musculoskeletal: Normal range of motion. She exhibits no edema.  Non-tender over SI joints and sciatic notch with good ROM of the hips and back.  Lymphadenopathy:    She has no cervical adenopathy.  Neurological: She is alert and oriented to person, place, and time. She has normal reflexes. No cranial nerve deficit.  Skin: Skin is warm and dry. No rash noted.  Psychiatric: She has a normal mood and affect. Her behavior  is normal.        BP 108/74  Pulse 63  Temp(Src) 98.5 F (36.9 C) (Oral)  Resp 16  Ht 5\' 4"  (1.626 m)  Wt 142 lb 3.2 oz (64.501 kg)  BMI 24.40 kg/m2  SpO2 100%  LMP 11/01/2013  Assessment & Plan:  Routine general medical examination at a health care facility - Plan: POCT UA - Microscopic Only, POCT urinalysis dipstick, CBC with Differential, COMPLETE METABOLIC PANEL WITH GFR, Lipid panel, TSH  Allergic rhinitis due to pollen - Plan: CBC with Differential   Br Nodule under eval   Current outpatient prescriptions  cetirizine (ZYRTEC) 10 MG tablet, Take 1 tablet (10 mg total) by mouth daily., Disp: 90 tablet, Rfl: 3;  Cholecalciferol (VITAMIN D3 PO), Take by mouth., Disp: , Rfl: ;   fluticasone (FLONASE) 50 MCG/ACT nasal spray, instill 1 spray into each nostril twice a day, Disp: 16 g, Rfl: 8 Consider singulair   I have completed the patient encounter in its entirety as documented by the scribe, with editing by me where necessary. Jeani Fassnacht P. Merla Richesoolittle, M.D.

## 2013-11-04 LAB — TSH: TSH: 0.93 u[IU]/mL (ref 0.350–4.500)

## 2013-11-09 ENCOUNTER — Encounter: Payer: Self-pay | Admitting: Internal Medicine

## 2013-11-30 ENCOUNTER — Telehealth: Payer: Self-pay

## 2013-11-30 NOTE — Telephone Encounter (Signed)
Pt is wanting a referral to [facility]   Best number [phone]

## 2013-11-30 NOTE — Telephone Encounter (Signed)
For sciatica

## 2013-12-01 NOTE — Telephone Encounter (Signed)
i need the results of mri done by Dr Yevette Edwardsumonski 2014  And his final office note---we have initial visit only

## 2013-12-03 NOTE — Telephone Encounter (Signed)
Can we get this MRI result please. Thanks so much

## 2013-12-07 NOTE — Telephone Encounter (Signed)
Faxed request for records 12/07/13 9:57 am.

## 2013-12-08 ENCOUNTER — Encounter: Payer: BC Managed Care – PPO | Admitting: Internal Medicine

## 2014-02-17 ENCOUNTER — Other Ambulatory Visit: Payer: Self-pay | Admitting: Family Medicine

## 2014-02-19 NOTE — Telephone Encounter (Signed)
Please refill Ambien 5 mg prn hs #30, 5 refills

## 2014-02-21 NOTE — Telephone Encounter (Signed)
Meds ordered this encounter  Medications  . zolpidem (AMBIEN) 10 MG tablet    Sig: TAKE ONE-HALF TABLET BY MOUTH AT BEDTIME AS NEEDED FOR SLEEP    Dispense:  15 tablet    Refill:  5

## 2014-02-22 NOTE — Telephone Encounter (Signed)
Faxed

## 2014-03-24 ENCOUNTER — Ambulatory Visit (INDEPENDENT_AMBULATORY_CARE_PROVIDER_SITE_OTHER): Payer: BC Managed Care – PPO

## 2014-03-24 ENCOUNTER — Ambulatory Visit (INDEPENDENT_AMBULATORY_CARE_PROVIDER_SITE_OTHER): Payer: BC Managed Care – PPO | Admitting: Podiatry

## 2014-03-24 DIAGNOSIS — R52 Pain, unspecified: Secondary | ICD-10-CM

## 2014-03-24 DIAGNOSIS — M2010 Hallux valgus (acquired), unspecified foot: Secondary | ICD-10-CM

## 2014-03-24 NOTE — Progress Notes (Signed)
   Subjective:    Patient ID: Rachael Acosta, female    DOB: 1965-09-18, 48 y.o.   MRN: [id number]  HPI  PT STATED B/L BUNION BEEN HURTING FOR LONG TERM. FEET ARE GETTING WORSE, NOT GETTING ANY BETTER. TRIED NO TREATMENT.  Review of Systems  All other systems reviewed and are negative.      Objective:   Physical Exam: I have reviewed her past medical history medications allergies surgeries social history and review of systems. Pulses are strongly palpable bilateral. Neurologic sensorium is intact per since once the monofilament. No calf pain. Deep tendon reflexes are intact bilateral. Muscle strength is 5 over 5 dorsiflexors plantar flexors inverters everters all his musculature is intact. Orthopedic evaluation demonstrates all joints distal to the ankle a full range of motion without crepitation. Cutaneous evaluation demonstrates supple well hydrated cutis without erythema edema cellulitis drainage or odor. Radiographic evaluation does demonstrate an increase in the first intermetatarsal angle greater than normal values the hallux abductus angle greater than normal values bilateral. Hypertrophic medial condyle first metatarsal head bilateral.        Assessment & Plan:  Assessment: Hallux abductovalgus deformities bilateral.  Plan: Discussed etiology pathology conservative versus surgical therapies. At this point she would like to consider surgical intervention. She has discussed this with me in the past so we once again went over consent form today line bylined number by number giving her ample time to ask questions she saw fit regarding a [place] bunion repair with screw fixation bilateral first metatarsophalangeal joint. I answered all the questions to the best of my ability in layman's terms regarding these procedures feel it. We did discuss a possible postop complications which may include but are not limited to postop pain bleeding swelling infection possible need for further surgeries over  correction and under correction. We did request information regarding her deductable from her insurance. She will sit with her employer and we will schedule surgery in the near future.

## 2014-03-25 ENCOUNTER — Telehealth: Payer: Self-pay | Admitting: *Deleted

## 2014-03-25 NOTE — Telephone Encounter (Signed)
I'm a patient of Dr. Al Corpus.  I'm calling to see when I can schedule the surgery appointment.  Please someone give me a call.

## 2014-03-29 NOTE — Telephone Encounter (Signed)
I returned her call.  I asked her how I could help her.  She stated she is waiting on her boss to let her know when is a good time to schedule surgery.  She asked, "How long will it take to get scheduled once I find out?"  I told her that Dr. Geryl Rankins surgery schedule is pretty open at this time.  She stated so I can schedule for any Friday.  I told her yes.  She stated, okay I will give you a call back with a date.

## 2014-04-01 ENCOUNTER — Ambulatory Visit (INDEPENDENT_AMBULATORY_CARE_PROVIDER_SITE_OTHER): Payer: BC Managed Care – PPO | Admitting: Family Medicine

## 2014-04-01 VITALS — BP 118/80 | HR 73 | Temp 97.3°F | Resp 16 | Ht 64.0 in | Wt 144.0 lb

## 2014-04-01 DIAGNOSIS — N390 Urinary tract infection, site not specified: Secondary | ICD-10-CM

## 2014-04-01 DIAGNOSIS — J0111 Acute recurrent frontal sinusitis: Secondary | ICD-10-CM

## 2014-04-01 DIAGNOSIS — J0110 Acute frontal sinusitis, unspecified: Secondary | ICD-10-CM

## 2014-04-01 DIAGNOSIS — S61209A Unspecified open wound of unspecified finger without damage to nail, initial encounter: Secondary | ICD-10-CM

## 2014-04-01 DIAGNOSIS — J329 Chronic sinusitis, unspecified: Secondary | ICD-10-CM

## 2014-04-01 LAB — POCT UA - MICROSCOPIC ONLY
Casts, Ur, LPF, POC: NEGATIVE
Crystals, Ur, HPF, POC: NEGATIVE
Mucus, UA: NEGATIVE
Yeast, UA: NEGATIVE

## 2014-04-01 LAB — POCT URINALYSIS DIPSTICK
BILIRUBIN UA: NEGATIVE
GLUCOSE UA: NEGATIVE
KETONES UA: NEGATIVE
Nitrite, UA: NEGATIVE
Protein, UA: NEGATIVE
SPEC GRAV UA: 1.015
Urobilinogen, UA: 0.2
pH, UA: 6.5

## 2014-04-01 MED ORDER — CEFTRIAXONE SODIUM 1 G IJ SOLR
250.0000 mg | Freq: Once | INTRAMUSCULAR | Status: AC
  Administered 2014-04-01: 250 mg via INTRAMUSCULAR

## 2014-04-01 MED ORDER — AMOXICILLIN-POT CLAVULANATE 875-125 MG PO TABS: 1.0000 | ORAL_TABLET | Freq: Two times a day (BID) | ORAL | Status: DC

## 2014-04-01 NOTE — Progress Notes (Addendum)
This chart was scribed for Elvina Sidle, MD by Charline Bills, ED Scribe. The patient was seen in room 1. Patient's care was started at 11:56 AM.  Patient ID: Rachael Acosta, female   DOB: [birth year], 48 y.o.   MRN: [id number]   Patient ID: Rachael Acosta MRN: [id number], DOB: Jan 30, 1966, 48 y.o. Date of Encounter: 04/01/2014, 11:56 AM  Primary Physician: No PCP Per Patient  Chief Complaint  Patient presents with   Urinary Tract Infection   Sinus Problem   Finger Injury    3 weeks ago   HPI: 48 y.o. year old female with history below presents with a R ring finger injury sustained a month ago. Pt states that she attempted to catch a football while on vacation when the injury occurred. Pt reports associated pain to her finger. She states that she has not been able to straighten her finger since the incident. Pt is L hand dominant.   Pt reports sinus pressure onset yesterday. She reports associated HA and tender glands Pt has taken Flonase and allergy medication with no relief.   Pt also presents with urinary symptoms for a few days. She suspects that she has an UTI.   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    Past Medical History  Diagnosis Date   Allergy      Home Meds: Prior to Admission medications   Medication Sig Start Date End Date Taking? Authorizing Provider  amoxicillin (AMOXIL) 500 MG capsule Take 2 capsules (1,000 mg total) by mouth 2 (two) times daily. 08/18/13  Yes Ethelda Chick, MD  b complex vitamins tablet Take 1 tablet by mouth daily.   Yes Historical Provider, MD  carisoprodol (SOMA) 250 MG tablet Take 1 tablet (250 mg total) by mouth at bedtime. 12/15/12  Yes Tonye Pearson, MD  cetirizine (ZYRTEC) 10 MG tablet Take 1 tablet (10 mg total) by mouth daily. 10/04/13  Yes Tonye Pearson, MD  Cholecalciferol (VITAMIN D3 PO) Take by mouth.   Yes Historical Provider, MD  fluocinonide-emollient (LIDEX-E) 0.05 % cream Apply  topically 2 (two) times daily. 09/09/12  Yes Tonye Pearson, MD  fluocinonide-emollient (LIDEX-E) 0.05 % cream Apply topically 2 (two) times daily. 04/27/13  Yes Tonye Pearson, MD  fluticasone [facility]) 50 MCG/ACT nasal spray instill 1 spray into each nostril twice a day 10/04/13  Yes Tonye Pearson, MD  HYDROcodone-homatropine [facility]) 5-1.5 MG/5ML syrup Take 5 mLs by mouth every 6 (six) hours as needed for cough. 08/18/13  Yes Ethelda Chick, MD  HYDROcodone-homatropine [facility]) 5-1.5 MG/5ML syrup Take 5 mLs by mouth every 6 (six) hours as needed for cough. 10/12/13  Yes Tonye Pearson, MD  meloxicam (MOBIC) 15 MG tablet Take 1 tablet (15 mg total) by mouth daily.   Yes Tonye Pearson, MD  Multiple Vitamin (MULTIVITAMIN) tablet Take 1 tablet by mouth daily.   Yes Historical Provider, MD  predniSONE (DELTASONE) 20 MG tablet 3/3/2/2/1/1 single daily dose for 6 days 10/12/13  Yes Tonye Pearson, MD  sildenafil (VIAGRA) 100 MG tablet 1/2 tab as directed no more than once a day 10/04/13  Yes Tonye Pearson, MD  traMADol Jane[facility] Sark) 50 MG tablet  01/28/14  Yes Historical Provider, MD  zolpidem (AMBIEN) 10 MG tablet TAKE ONE-HALF TABLET BY MOUTH AT BEDTIME AS NEEDED FOR SLEEP   Yes Tonye Pearson, MD    Allergies: No Known Allergies  History   Social History  Marital Status: Married    Spouse Name: N/A    Number of Children: N/A   Years of Education: N/A   Occupational History   Not on file.   Social History Main Topics   Smoking status: Never Smoker    Smokeless tobacco: Not on file   Alcohol Use: 0.5 oz/week    1 drink(s) per week     Comment: occassionally   Drug Use: No   Sexual Activity: Yes   Other Topics Concern   Not on file   Social History Narrative   2 pregnancines   2 live births     Review of Systems: Constitutional: negative for chills, fever, night sweats, weight changes, or fatigue  HEENT: negative for vision changes, hearing  loss, congestion, rhinorrhea, ST, or epistaxis, +sinus pressure Cardiovascular: negative for chest pain or palpitations Respiratory: negative for hemoptysis, wheezing, shortness of breath, or cough Abdominal: negative for abdominal pain, nausea, vomiting, diarrhea, or constipation Msk: +myalgias  Dermatological: negative for rash GU: +urinary symptoms Neurologic: negative for dizziness, or syncope, +headache All other systems reviewed and are otherwise negative with the exception to those above and in the HPI.  Physical Exam: Triage Vitals: Blood pressure 118/80, pulse 73, temperature 97.3 F (36.3 C), temperature source Oral, resp. rate 16, height $RemoveBe'5\' 4"'GgzOqoTLq$  (1.626 m), weight 144 lb (65.318 kg), last menstrual period 03/25/2014., Body mass index is 24.71 kg/(m^2). General: Well developed, well nourished, in no acute distress. Head: Normocephalic, atraumatic, eyes without discharge, sclera non-icteric, nares are without discharge. Bilateral auditory canals clear, TM's are without perforation, pearly grey and translucent with reflective cone of light bilaterally. Oral cavity moist, posterior pharynx without exudate, erythema, peritonsillar abscess, or post nasal drip.  Neck: Supple. No thyromegaly. Full ROM. No lymphadenopathy. Lungs: Clear bilaterally to auscultation without wheezes, rales, or rhonchi. Breathing is unlabored. Heart: RRR with S1 S2. No murmurs, rubs, or gallops appreciated. Abdomen: Soft, non-tender, non-distended with normoactive bowel sounds. No hepatomegaly. No rebound/guarding. No obvious abdominal masses. Msk:  Strength and tone normal for age. Extremities/Skin: Warm and dry. No clubbing or cyanosis. No edema. No rashes or suspicious lesions. Neuro: Alert and oriented X 3. Moves all extremities spontaneously. Gait is normal. CNII-XII grossly in tact. Psych:  Responds to questions appropriately with a normal affect.  Nasal passages congested with mucopurulent  discharge Labs: Results for orders placed in visit on 11/03/13  CBC WITH DIFFERENTIAL      Result Value Ref Range   WBC 4.6  4.0 - 10.5 K/uL   RBC 4.20  3.87 - 5.11 MIL/uL   Hemoglobin 13.0  12.0 - 15.0 g/dL   HCT 37.8  36.0 - 46.0 %   MCV 90.0  78.0 - 100.0 fL   MCH 31.0  26.0 - 34.0 pg   MCHC 34.4  30.0 - 36.0 g/dL   RDW 13.3  11.5 - 15.5 %   Platelets 248  150 - 400 K/uL   Neutrophils Relative % 59  43 - 77 %   Neutro Abs 2.7  1.7 - 7.7 K/uL   Lymphocytes Relative 30  12 - 46 %   Lymphs Abs 1.4  0.7 - 4.0 K/uL   Monocytes Relative 7  3 - 12 %   Monocytes Absolute 0.3  0.1 - 1.0 K/uL   Eosinophils Relative 4  0 - 5 %   Eosinophils Absolute 0.2  0.0 - 0.7 K/uL   Basophils Relative 0  0 - 1 %   Basophils Absolute 0.0  0.0 - 0.1 K/uL   Smear Review Criteria for review not met    COMPLETE METABOLIC PANEL WITH GFR      Result Value Ref Range   Sodium 138  135 - 145 mEq/L   Potassium 4.1  3.5 - 5.3 mEq/L   Chloride 103  96 - 112 mEq/L   CO2 26  19 - 32 mEq/L   Glucose, Bld 88  70 - 99 mg/dL   BUN 11  6 - 23 mg/dL   Creat 0.93  0.50 - 1.10 mg/dL   Total Bilirubin 0.7  0.2 - 1.2 mg/dL   Alkaline Phosphatase 44  39 - 117 U/L   AST 20  0 - 37 U/L   ALT 13  0 - 35 U/L   Total Protein 6.9  6.0 - 8.3 g/dL   Albumin 3.9  3.5 - 5.2 g/dL   Calcium 8.6  8.4 - 10.5 mg/dL   GFR, Est African American 83     GFR, Est Non African American 72    LIPID PANEL      Result Value Ref Range   Cholesterol 169  0 - 200 mg/dL   Triglycerides 93  <150 mg/dL   HDL 59  >39 mg/dL   Total CHOL/HDL Ratio 2.9     VLDL 19  0 - 40 mg/dL   LDL Cholesterol 91  0 - 99 mg/dL  TSH      Result Value Ref Range   TSH 0.930  0.350 - 4.500 uIU/mL  POCT UA - MICROSCOPIC ONLY      Result Value Ref Range   WBC, Ur, HPF, POC tntc     RBC, urine, microscopic 9-15     Bacteria, U Microscopic 3+     Mucus, UA pos     Epithelial cells, urine per micros tntc     Crystals, Ur, HPF, POC neg     Casts, Ur, LPF, POC  neg     Yeast, UA neg     Amorphous pos    POCT URINALYSIS DIPSTICK      Result Value Ref Range   Color, UA yellow     Clarity, UA cloudy     Glucose, UA neg     Bilirubin, UA neg     Ketones, UA neg     Spec Grav, UA 1.020     Blood, UA large     pH, UA 7.5     Protein, UA 30     Urobilinogen, UA 0.2     Nitrite, UA neg     Leukocytes, UA small (1+)       ASSESSMENT AND PLAN:  48 y.o. year old female with  1. Urinary tract infection, site not specified   2. Avulsion right extensor tendon of ring finger 3. sinusitis I personally performed the services described in this documentation, which was scribed in my presence. The recorded information has been reviewed and is accurate.  Signed, Robyn Haber, MD 04/01/2014 12:06 PM

## 2014-04-03 LAB — URINE CULTURE: Colony Count: 75000

## 2014-04-08 ENCOUNTER — Telehealth: Payer: Self-pay | Admitting: *Deleted

## 2014-04-08 NOTE — Telephone Encounter (Signed)
I'm calling to try to get an appointment for my foot surgery.  So could someone call me back.  Thank you.  I called the patient and asked what day she is interested in.  She requested 05/06/2014.  I told her that date is fine.  She stated, "He told me to come by the office to pick up surgical boots.  I am having both feet done."  I told her he typically doesn't give out 2 air fracture walkers at a time.  Let me ask him about this before you come to pick it up.  I told her I will call and give you his reply when I get a response.  She stated, "Okay thank you."

## 2014-04-18 ENCOUNTER — Ambulatory Visit (INDEPENDENT_AMBULATORY_CARE_PROVIDER_SITE_OTHER): Payer: BC Managed Care – PPO | Admitting: Family Medicine

## 2014-04-18 VITALS — BP 118/80 | HR 75 | Temp 98.5°F | Resp 16 | Ht 64.0 in | Wt 143.6 lb

## 2014-04-18 DIAGNOSIS — K219 Gastro-esophageal reflux disease without esophagitis: Secondary | ICD-10-CM

## 2014-04-18 DIAGNOSIS — R300 Dysuria: Secondary | ICD-10-CM

## 2014-04-18 DIAGNOSIS — Z113 Encounter for screening for infections with a predominantly sexual mode of transmission: Secondary | ICD-10-CM

## 2014-04-18 DIAGNOSIS — N390 Urinary tract infection, site not specified: Secondary | ICD-10-CM

## 2014-04-18 DIAGNOSIS — R350 Frequency of micturition: Secondary | ICD-10-CM

## 2014-04-18 LAB — POCT URINALYSIS DIPSTICK
Bilirubin, UA: NEGATIVE
Glucose, UA: NEGATIVE
Ketones, UA: NEGATIVE
Nitrite, UA: NEGATIVE
Protein, UA: 30
Spec Grav, UA: 1.01
Urobilinogen, UA: 0.2
pH, UA: 6

## 2014-04-18 LAB — POCT UA - MICROSCOPIC ONLY
Casts, Ur, LPF, POC: NEGATIVE
Crystals, Ur, HPF, POC: NEGATIVE
Mucus, UA: NEGATIVE
Yeast, UA: NEGATIVE

## 2014-04-18 MED ORDER — CEFTRIAXONE SODIUM 1 G IJ SOLR
1.0000 g | Freq: Once | INTRAMUSCULAR | Status: AC
  Administered 2014-04-18: 1 g via INTRAMUSCULAR

## 2014-04-18 MED ORDER — CEPHALEXIN 500 MG PO CAPS
500.0000 mg | ORAL_CAPSULE | Freq: Two times a day (BID) | ORAL | Status: DC
Start: 2014-04-18 — End: 2014-05-02

## 2014-04-18 NOTE — Progress Notes (Signed)
Chief Complaint:  Chief Complaint  Patient presents with  . Urinary Tract Infection    painful urination, x1-3 days, had one 2 weeks ago  . Exposure to STD    wants to be tested for STDs  . Gastrophageal Reflux    Pt is not sure    HPI: Rachael Acosta is a 48 y.o. female who is here for:  1. 1 month hisotry of "feleing throat is sticky, and tight" , she has not been eating well, feels like it is congested and there is heart burn in her throat. She has no pain, no nausea or vomiting or abd pain or unintentional weight loss, there is a feeling of a ball of mucus in her throat which stuck, she does have allergies and is takign her allery meds, she denies any prior h/o GERD, She not tried anything but alkaselzter plus,  She has it all day. She denis any worsening sxs with liquids or solids.  2. STD testing , she is monogamous. She has a boyfriend. She had chlamydia a long time ago. Would like STD screening  3. She has UTI sxs, she has had recurrent UTIs, last time she had a UTI she grew out e coli and strep viridans but only 70,000 colonies, she was sxs and was put on amoxacillin , this was on 9/18. She has similar sxs of increwase frequency, dysuria and generalized discomfort in her pelvic area Denies any f/c/na/v .  Past Medical History  Diagnosis Date  . Allergy    Past Surgical History  Procedure Laterality Date  . Breast enhancement surgery    . Breast surgery    . Eye surgery     History   Social History  . Marital Status: Married    Spouse Name: N/A    Number of Children: N/A  . Years of Education: N/A   Social History Main Topics  . Smoking status: Never Smoker   . Smokeless tobacco: None  . Alcohol Use: 0.5 oz/week    1 drink(s) per week     Comment: occassionally  . Drug Use: No  . Sexual Activity: Yes   Other Topics Concern  . None   Social History Narrative   2 pregnancines   2 live births   Family History  Problem Relation Age of Onset  .  Hypertension Mother   . Diabetes Mother   . Diabetes Father   . Hypertension Father   . Eczema Daughter   . Eczema Daughter    No Known Allergies Prior to Admission medications   Medication Sig Start Date End Date Taking? Authorizing Provider  amoxicillin-clavulanate (AUGMENTIN) 875-125 MG per tablet Take 1 tablet by mouth 2 (two) times daily. 04/01/14  Yes Elvina Sidle, MD  b complex vitamins tablet Take 1 tablet by mouth daily.   Yes Historical Provider, MD  carisoprodol (SOMA) 250 MG tablet Take 1 tablet (250 mg total) by mouth at bedtime. 12/15/12  Yes Tonye Pearson, MD  cetirizine (ZYRTEC) 10 MG tablet Take 1 tablet (10 mg total) by mouth daily. 10/04/13  Yes Tonye Pearson, MD  Cholecalciferol (VITAMIN D3 PO) Take by mouth.   Yes Historical Provider, MD  fluocinonide-emollient (LIDEX-E) 0.05 % cream Apply topically 2 (two) times daily. 09/09/12  Yes Tonye Pearson, MD  fluocinonide-emollient (LIDEX-E) 0.05 % cream Apply topically 2 (two) times daily. 04/27/13  Yes Tonye Pearson, MD  fluticasone (FLONASE) 50 MCG/ACT nasal spray instill 1 spray into  each nostril twice a day 10/04/13  Yes Tonye Pearson, MD  meloxicam (MOBIC) 15 MG tablet Take 1 tablet (15 mg total) by mouth daily.   Yes Tonye Pearson, MD  Multiple Vitamin (MULTIVITAMIN) tablet Take 1 tablet by mouth daily.   Yes Historical Provider, MD  predniSONE (DELTASONE) 20 MG tablet 3/3/2/2/1/1 single daily dose for 6 days 10/12/13  Yes Tonye Pearson, MD  sildenafil (VIAGRA) 100 MG tablet 1/2 tab as directed no more than once a day 10/04/13  Yes Tonye Pearson, MD  traMADol Janean Sark) 50 MG tablet  01/28/14  Yes Historical Provider, MD  zolpidem (AMBIEN) 10 MG tablet TAKE ONE-HALF TABLET BY MOUTH AT BEDTIME AS NEEDED FOR SLEEP   Yes Tonye Pearson, MD     ROS: The patient denies fevers, chills, night sweats, unintentional weight loss, chest pain, palpitations, wheezing, dyspnea on exertion, nausea,  vomiting, abdominal pain,  hematuria, melena, numbness, weakness, or tingling.   All other systems have been reviewed and were otherwise negative with the exception of those mentioned in the HPI and as above.    PHYSICAL EXAM: Filed Vitals:   04/18/14 1551  BP: 118/80  Pulse: 75  Temp: 98.5 F (36.9 C)  Resp: 16   Filed Vitals:   04/18/14 1551  Height: 5\' 4"  (1.626 m)  Weight: 143 lb 9.6 oz (65.137 kg)   Body mass index is 24.64 kg/(m^2).  General: Alert, no acute distress HEENT:  Normocephalic, atraumatic, oropharynx patent. EOMI, PERRLA Cardiovascular:  Regular rate and rhythm, no rubs murmurs or gallops.  No Carotid bruits, radial pulse intact. No pedal edema.  Respiratory: Clear to auscultation bilaterally.  No wheezes, rales, or rhonchi.  No cyanosis, no use of accessory musculature GI: No organomegaly, abdomen is soft and non-tender, positive bowel sounds.  No masses. Skin: No rashes. Neurologic: Facial musculature symmetric. Psychiatric: Patient is appropriate throughout our interaction. Lymphatic: No cervical lymphadenopathy Musculoskeletal: Gait intact.   LABS: Results for orders placed in visit on 04/18/14  POCT UA - MICROSCOPIC ONLY      Result Value Ref Range   WBC, Ur, HPF, POC TNTC     RBC, urine, microscopic 5-12     Bacteria, U Microscopic 1+     Mucus, UA neg     Epithelial cells, urine per micros 0-2     Crystals, Ur, HPF, POC neg     Casts, Ur, LPF, POC neg     Yeast, UA neg    POCT URINALYSIS DIPSTICK      Result Value Ref Range   Color, UA yellow     Clarity, UA cloudy     Glucose, UA neg     Bilirubin, UA neg     Ketones, UA neg     Spec Grav, UA 1.010     Blood, UA large     pH, UA 6.0     Protein, UA 30     Urobilinogen, UA 0.2     Nitrite, UA neg     Leukocytes, UA moderate (2+)       EKG/XRAY:   Primary read interpreted by Dr. Conley Rolls at [facility].   ASSESSMENT/PLAN: Encounter Diagnoses  Name Primary?  . Dysuria   . Increased  urinary frequency   . Screen for STD (sexually transmitted disease)   . UTI (lower urinary tract infection) Yes  . Gastroesophageal reflux disease without esophagitis    Recurrent UTIs-Rocephin IM x 1 at patient's request of a strong medicine,  rx Keflex 500 mg BID x 5 days, Urine cx pending, possible consideration of being on PPX abx for UTIs after sex STD screening-will screen for hep C and B as well at patient request, HIV, RPR, GC, HSV Globus feeling in throat-possibly just PND and mucus vs GERD, gave nexium samples to try, she will cont with generic zyrtec F/u prn  Gross sideeffects, risk and benefits, and alternatives of medications d/w patient. Patient is aware that all medications have potential sideeffects and we are unable to predict every sideeffect or drug-drug interaction that may occur.  Chrystal Zeimet PHUONG, DO 04/18/2014 5:18 PM

## 2014-04-18 NOTE — Patient Instructions (Signed)
Urinary Tract Infection Urinary tract infections (UTIs) can develop anywhere along your urinary tract. Your urinary tract is your body's drainage system for removing wastes and extra water. Your urinary tract includes two kidneys, two ureters, a bladder, and a urethra. Your kidneys are a pair of bean-shaped organs. Each kidney is about the size of your fist. They are located below your ribs, one on each side of your spine. CAUSES Infections are caused by microbes, which are microscopic organisms, including fungi, viruses, and bacteria. These organisms are so small that they can only be seen through a microscope. Bacteria are the microbes that most commonly cause UTIs. SYMPTOMS  Symptoms of UTIs may vary by age and gender of the patient and by the location of the infection. Symptoms in young women typically include a frequent and intense urge to urinate and a painful, burning feeling in the bladder or urethra during urination. Older women and men are more likely to be tired, shaky, and weak and have muscle aches and abdominal pain. A fever may mean the infection is in your kidneys. Other symptoms of a kidney infection include pain in your back or sides below the ribs, nausea, and vomiting. DIAGNOSIS To diagnose a UTI, your caregiver will ask you about your symptoms. Your caregiver also will ask to provide a urine sample. The urine sample will be tested for bacteria and white blood cells. White blood cells are made by your body to help fight infection. TREATMENT  Typically, UTIs can be treated with medication. Because most UTIs are caused by a bacterial infection, they usually can be treated with the use of antibiotics. The choice of antibiotic and length of treatment depend on your symptoms and the type of bacteria causing your infection. HOME CARE INSTRUCTIONS  If you were prescribed antibiotics, take them exactly as your caregiver instructs you. Finish the medication even if you feel better after you  have only taken some of the medication.  Drink enough water and fluids to keep your urine clear or pale yellow.  Avoid caffeine, tea, and carbonated beverages. They tend to irritate your bladder.  Empty your bladder often. Avoid holding urine for long periods of time.  Empty your bladder before and after sexual intercourse.  After a bowel movement, women should cleanse from front to back. Use each tissue only once. SEEK MEDICAL CARE IF:   You have back pain.  You develop a fever.  Your symptoms do not begin to resolve within 3 days. SEEK IMMEDIATE MEDICAL CARE IF:   You have severe back pain or lower abdominal pain.  You develop chills.  You have nausea or vomiting.  You have continued burning or discomfort with urination. MAKE SURE YOU:   Understand these instructions.  Will watch your condition.  Will get help right away if you are not doing well or get worse. Document Released: 04/10/2005 Document Revised: 12/31/2011 Document Reviewed: 08/09/2011 [facility] Patient Information 2015 [place]ExitCare, [place]LL[place]. This information is not intended to replace advice given to you by your health care provider. Make sure you discuss any questions you have with your health care provider. Gastroesophageal Reflux Disease, Adult Gastroesophageal reflux disease (GERD) happens when acid from your stomach flows up into the esophagus. When acid comes in contact with the esophagus, the acid causes soreness (inflammation) in the esophagus. Over time, GERD may create small holes (ulcers) in the lining of the esophagus. CAUSES   Increased body weight. This puts pressure on the stomach, making acid rise from the stomach  from the stomach into the esophagus. °· Smoking. This increases acid production in the stomach. °· Drinking alcohol. This causes decreased pressure in the lower esophageal sphincter (valve or ring of muscle between the esophagus and stomach), allowing acid from the stomach into the esophagus. °· Late  evening meals and a full stomach. This increases pressure and acid production in the stomach. °· A malformed lower esophageal sphincter. °Sometimes, no cause is found. °SYMPTOMS  °· Burning pain in the lower part of the mid-chest behind the breastbone and in the mid-stomach area. This may occur twice a week or more often. °· Trouble swallowing. °· Sore throat. °· Dry cough. °· Asthma-like symptoms including chest tightness, shortness of breath, or wheezing. °DIAGNOSIS  °Your caregiver may be able to diagnose GERD based on your symptoms. In some cases, X-rays and other tests may be done to check for complications or to check the condition of your stomach and esophagus. °TREATMENT  °Your caregiver may recommend over-the-counter or prescription medicines to help decrease acid production. Ask your caregiver before starting or adding any new medicines.  °HOME CARE INSTRUCTIONS  °· Change the factors that you can control. Ask your caregiver for guidance concerning weight loss, quitting smoking, and alcohol consumption. °· Avoid foods and drinks that make your symptoms worse, such as: °¨ Caffeine or alcoholic drinks. °¨ Chocolate. °¨ Peppermint or mint flavorings. °¨ Garlic and onions. °¨ Spicy foods. °¨ Citrus fruits, such as oranges, lemons, or limes. °¨ Tomato-based foods such as sauce, chili, salsa, and pizza. °¨ Fried and fatty foods. °· Avoid lying down for the 3 hours prior to your bedtime or prior to taking a nap. °· Eat small, frequent meals instead of large meals. °· Wear loose-fitting clothing. Do not wear anything tight around your waist that causes pressure on your stomach. °· Raise the head of your bed 6 to 8 inches with wood blocks to help you sleep. Extra pillows will not help. °· Only take over-the-counter or prescription medicines for pain, discomfort, or fever as directed by your caregiver. °· Do not take aspirin, ibuprofen, or other nonsteroidal anti-inflammatory drugs (NSAIDs). °SEEK IMMEDIATE MEDICAL  CARE IF:  °· You have pain in your arms, neck, jaw, teeth, or back. °· Your pain increases or changes in intensity or duration. °· You develop nausea, vomiting, or sweating (diaphoresis). °· You develop shortness of breath, or you faint. °· Your vomit is green, yellow, black, or looks like coffee grounds or blood. °· Your stool is red, bloody, or black. °These symptoms could be signs of other problems, such as heart disease, gastric bleeding, or esophageal bleeding. °MAKE SURE YOU:  °· Understand these instructions. °· Will watch your condition. °· Will get help right away if you are not doing well or get worse. °Document Released: 04/10/2005 Document Revised: 09/23/2011 Document Reviewed: 01/18/2011 °ExitCare® Patient Information ©2015 Exit[employer], LLC. This information is not intended to replace advice given to you by your health care provider. Make sure you discuss any questions you have with your health care provider. ° °

## 2014-04-18 NOTE — Telephone Encounter (Signed)
Ok to provide two boots

## 2014-04-19 LAB — HIV ANTIBODY (ROUTINE TESTING W REFLEX): HIV 1&2 Ab, 4th Generation: NONREACTIVE

## 2014-04-19 LAB — HEPATITIS B SURFACE ANTIGEN: Hepatitis B Surface Ag: NEGATIVE

## 2014-04-19 LAB — HSV(HERPES SIMPLEX VRS) I + II AB-IGG
HSV 1 Glycoprotein G Ab, IgG: 6.66 IV — ABNORMAL HIGH
HSV 2 Glycoprotein G Ab, IgG: 6.55 IV — ABNORMAL HIGH

## 2014-04-19 LAB — RPR

## 2014-04-19 LAB — HEPATITIS C ANTIBODY: HCV Ab: NEGATIVE

## 2014-04-19 NOTE — Telephone Encounter (Signed)
I called and informed her that Dr. Al CorpusHyatt wants her to have two boots.  I asked her what size shoe she wears.  She said a  8 1/2 or 9.  She asked, "Is it okay for me to come by and pick it up on Monday?"  I told her that is fine I will have them up front for you.

## 2014-04-20 LAB — URINE CULTURE: Colony Count: 25000

## 2014-04-21 LAB — GC/CHLAMYDIA PROBE AMP
CT Probe RNA: NEGATIVE
GC Probe RNA: NEGATIVE

## 2014-04-25 DIAGNOSIS — M2010 Hallux valgus (acquired), unspecified foot: Secondary | ICD-10-CM

## 2014-04-26 ENCOUNTER — Telehealth: Payer: Self-pay | Admitting: Family Medicine

## 2014-04-26 NOTE — Telephone Encounter (Signed)
LM to call us back or we will try again to talk to her about labs

## 2014-04-26 NOTE — Telephone Encounter (Signed)
No answer

## 2014-04-29 ENCOUNTER — Encounter: Payer: Self-pay | Admitting: Family Medicine

## 2014-04-29 ENCOUNTER — Telehealth: Payer: Self-pay | Admitting: Family Medicine

## 2014-04-29 NOTE — Telephone Encounter (Signed)
LM to cal me back abotu reulst, will send letter out

## 2014-05-02 ENCOUNTER — Ambulatory Visit (INDEPENDENT_AMBULATORY_CARE_PROVIDER_SITE_OTHER): Payer: BC Managed Care – PPO | Admitting: Family Medicine

## 2014-05-02 VITALS — BP 118/68 | HR 66 | Temp 98.0°F | Resp 17 | Ht 64.0 in | Wt 143.0 lb

## 2014-05-02 DIAGNOSIS — M25559 Pain in unspecified hip: Secondary | ICD-10-CM

## 2014-05-02 DIAGNOSIS — N390 Urinary tract infection, site not specified: Secondary | ICD-10-CM

## 2014-05-02 DIAGNOSIS — R300 Dysuria: Secondary | ICD-10-CM

## 2014-05-02 LAB — POCT WET PREP WITH KOH
Clue Cells Wet Prep HPF POC: NEGATIVE
KOH Prep POC: NEGATIVE
RBC Wet Prep HPF POC: NEGATIVE
Trichomonas, UA: NEGATIVE
Yeast Wet Prep HPF POC: NEGATIVE

## 2014-05-02 LAB — POCT URINALYSIS DIPSTICK
Bilirubin, UA: NEGATIVE
Glucose, UA: NEGATIVE
Ketones, UA: NEGATIVE
Nitrite, UA: NEGATIVE
Protein, UA: 30
Spec Grav, UA: 1.015
Urobilinogen, UA: 0.2
pH, UA: 8

## 2014-05-02 LAB — POCT UA - MICROSCOPIC ONLY
Casts, Ur, LPF, POC: NEGATIVE
Crystals, Ur, HPF, POC: NEGATIVE
Mucus, UA: NEGATIVE
Yeast, UA: NEGATIVE

## 2014-05-02 MED ORDER — CIPROFLOXACIN HCL 250 MG PO TABS: 250.0000 mg | ORAL_TABLET | Freq: Two times a day (BID) | ORAL | Status: DC

## 2014-05-02 NOTE — Patient Instructions (Signed)
PLEASE STOP DOUCHING  Urinary Tract Infection A urinary tract infection (UTI) can occur any place along the urinary tract. The tract includes the kidneys, ureters, bladder, and urethra. A type of germ called bacteria often causes a UTI. UTIs are often helped with antibiotic medicine.  HOME CARE   If given, take antibiotics as told by your doctor. Finish them even if you start to feel better.  Drink enough fluids to keep your pee (urine) clear or pale yellow.  Avoid tea, drinks with caffeine, and bubbly (carbonated) drinks.  Pee often. Avoid holding your pee in for a long time.  Pee before and after having sex (intercourse).  Wipe from front to back after you poop (bowel movement) if you are a woman. Use each tissue only once. GET HELP RIGHT AWAY IF:   You have back pain.  You have lower belly (abdominal) pain.  You have chills.  You feel sick to your stomach (nauseous).  You throw up (vomit).  Your burning or discomfort with peeing does not go away.  You have a fever.  Your symptoms are not better in 3 days. MAKE SURE YOU:   Understand these instructions.  Will watch your condition.  Will get help right away if you are not doing well or get worse. Document Released: 12/18/2007 Document Revised: 03/25/2012 Document Reviewed: 01/30/2012 [facility] Center[facility] Patient Information 2015 [place]ExitCare, [place]LLC. This information is not intended to replace advice given to you by your health care provider. Make sure you discuss any questions you have with your health care provider.

## 2014-05-02 NOTE — Progress Notes (Signed)
 Chief Complaint:  Chief Complaint  Patient presents with  . Dysuria    HPI: Rachael Acosta is a 48 y.o. female who is here for  Dysuria. Feels like she has sxs after sex all the time, irritation.  Denies fevers, chills, n/v/abd apin. She denies any vaginal dc, itching, she has pain with urination and that is it. She douches regular but states she had this problem even before she was douching  Past Medical History  Diagnosis Date  . Allergy    Past Surgical History  Procedure Laterality Date  . Breast enhancement surgery    . Breast surgery    . Eye surgery     History   Social History  . Marital Status: Married    Spouse Name: N/A    Number of Children: N/A  . Years of Education: N/A   Social History Main Topics  . Smoking status: Never Smoker   . Smokeless tobacco: None  . Alcohol Use: 0.5 oz/week    1 drink(s) per week     Comment: occassionally  . Drug Use: No  . Sexual Activity: Yes   Other Topics Concern  . None   Social History Narrative   2 pregnancines   2 live births   Family History  Problem Relation Age of Onset  . Hypertension Mother   . Diabetes Mother   . Diabetes Father   . Hypertension Father   . Eczema Daughter   . Eczema Daughter    No Known Allergies Prior to Admission medications   Medication Sig Start Date End Date Taking? Authorizing Provider  b complex vitamins tablet Take 1 tablet by mouth daily.   Yes Historical Provider, MD  cephALEXin (KEFLEX) 500 MG capsule Take 1 capsule (500 mg total) by mouth 2 (two) times daily. 04/18/14  Yes  P , DO  cetirizine (ZYRTEC) 10 MG tablet Take 1 tablet (10 mg total) by mouth daily. 10/04/13  Yes Tonye Pearsonobert P Doolittle, MD  Cholecalciferol (VITAMIN D3 PO) Take by mouth.   Yes Historical Provider, MD  fluocinonide-emollient (LIDEX-E) 0.05 % cream Apply topically 2 (two) times daily. 09/09/12  Yes Tonye Pearsonobert P Doolittle, MD  fluocinonide-emollient (LIDEX-E) 0.05 % cream Apply topically 2 (two)  times daily. 04/27/13  Yes Tonye Pearsonobert P Doolittle, MD  fluticasone Aleda Grana(FLONASE) 50 MCG/ACT nasal spray instill 1 spray into each nostril twice a day 10/04/13  Yes Tonye Pearsonobert P Doolittle, MD  Multiple Vitamin (MULTIVITAMIN) tablet Take 1 tablet by mouth daily.   Yes Historical Provider, MD  traMADol Janean Sark(ULTRAM) 50 MG tablet  01/28/14  Yes Historical Provider, MD  zolpidem (AMBIEN) 10 MG tablet TAKE ONE-HALF TABLET BY MOUTH AT BEDTIME AS NEEDED FOR SLEEP   Yes Tonye Pearsonobert P Doolittle, MD     ROS: The patient denies fevers, chills, night sweats, unintentional weight loss, chest pain, palpitations, wheezing, dyspnea on exertion, nausea, vomiting, abdominal pain,  hematuria, melena, numbness, weakness, or tingling.  All other systems have been reviewed and were otherwise negative with the exception of those mentioned in the HPI and as above.    PHYSICAL EXAM: Filed Vitals:   05/02/14 0911  BP: 118/68  Pulse: 66  Temp: 98 F (36.7 C)  Resp: 17   Filed Vitals:   05/02/14 0911  Height: 5\' 4"  (1.626 m)  Weight: 143 lb (64.864 kg)   Body mass index is 24.53 kg/(m^2).  General: Alert, no acute distress HEENT:  Normocephalic, atraumatic, oropharynx patent. EOMI, PERRLA Cardiovascular:  Regular rate  and rhythm, no rubs murmurs or gallops.  No Carotid bruits, radial pulse intact. No pedal edema.  Respiratory: Clear to auscultation bilaterally.  No wheezes, rales, or rhonchi.  No cyanosis, no use of accessory musculature GI: No organomegaly, abdomen is soft and non-tender, positive bowel sounds.  No masses. Skin: No rashes. Neurologic: Facial musculature symmetric. Psychiatric: Patient is appropriate throughout our interaction. Lymphatic: No cervical lymphadenopathy Musculoskeletal: Gait intact. GU-no cmt, no dc, cervix looks nl, no rashes/lesions/masses, no odor   LABS: Results for orders placed in visit on 05/02/14  POCT WET PREP WITH KOH      Result Value Ref Range   Trichomonas, UA Negative     Clue  Cells Wet Prep HPF POC neg     Epithelial Wet Prep HPF POC 0-10     Yeast Wet Prep HPF POC neg     Bacteria Wet Prep HPF POC trace     RBC Wet Prep HPF POC neg     WBC Wet Prep HPF POC 0-1     KOH Prep POC Negative    POCT UA - MICROSCOPIC ONLY      Result Value Ref Range   WBC, Ur, HPF, POC tntc     RBC, urine, microscopic tntc     Bacteria, U Microscopic 1+     Mucus, UA neg     Epithelial cells, urine per micros 0-3     Crystals, Ur, HPF, POC neg     Casts, Ur, LPF, POC neg     Yeast, UA neg    POCT URINALYSIS DIPSTICK      Result Value Ref Range   Color, UA yellow     Clarity, UA cloudy     Glucose, UA neg     Bilirubin, UA neg     Ketones, UA neg     Spec Grav, UA 1.015     Blood, UA moderate     pH, UA 8.0     Protein, UA 30     Urobilinogen, UA 0.2     Nitrite, UA neg     Leukocytes, UA moderate (2+)       EKG/XRAY:   [facility]   ASSESSMENT/PLAN: Encounter Diagnoses  Name Primary?  . Dysuria   . Pain in joint, pelvic region and thigh, unspecified laterality   . UTI (lower urinary tract infection) Yes  . Recurrent UTI    Urine cx pending RX cipro 250 mg BID x 3 days Refer to urology per pt request F/u with urology or prn  Gross sideeffects, risk and benefits, and alternatives of medications d/w patient. Patient is aware that all medications have potential sideeffects and we are unable to predict every sideeffect or drug-drug interaction that may occur.  ,  PHUONG, DO 05/02/2014 9:59 AM

## 2014-05-03 ENCOUNTER — Other Ambulatory Visit: Payer: Self-pay | Admitting: Podiatry

## 2014-05-03 MED ORDER — PROMETHAZINE HCL 25 MG PO TABS: 25.0000 mg | ORAL_TABLET | Freq: Three times a day (TID) | ORAL | Status: DC | PRN

## 2014-05-03 MED ORDER — CEPHALEXIN 500 MG PO CAPS: 500.0000 mg | ORAL_CAPSULE | Freq: Three times a day (TID) | ORAL | Status: DC

## 2014-05-03 MED ORDER — OXYCODONE-ACETAMINOPHEN 10-325 MG PO TABS: ORAL_TABLET | ORAL | Status: DC

## 2014-05-04 ENCOUNTER — Telehealth: Payer: Self-pay | Admitting: Family Medicine

## 2014-05-04 ENCOUNTER — Telehealth: Payer: Self-pay

## 2014-05-04 ENCOUNTER — Other Ambulatory Visit: Payer: Self-pay | Admitting: Family Medicine

## 2014-05-04 DIAGNOSIS — N390 Urinary tract infection, site not specified: Secondary | ICD-10-CM

## 2014-05-04 LAB — URINE CULTURE

## 2014-05-04 MED ORDER — CIPROFLOXACIN HCL 500 MG PO TABS: 500.0000 mg | ORAL_TABLET | Freq: Two times a day (BID) | ORAL | Status: DC

## 2014-05-04 NOTE — Telephone Encounter (Signed)
Pt states she was give Cipro antibiotic for illness and forgot to mention to the provider that her Body has become resistant to that particular antibiotic and would like it changed   Best phone for pt is [phone]  Pharmacy [facility]

## 2014-05-04 NOTE — Telephone Encounter (Signed)
Spoke to pt about labs 

## 2014-05-05 MED ORDER — CEPHALEXIN 500 MG PO CAPS: 500.0000 mg | ORAL_CAPSULE | Freq: Two times a day (BID) | ORAL | Status: DC

## 2014-05-05 NOTE — Telephone Encounter (Signed)
Called and informed pt of medication change.

## 2014-05-05 NOTE — Telephone Encounter (Signed)
Please tell her to stop cipro, I will rx her keflex 50 0mg  BID x 7 days

## 2014-05-05 NOTE — Telephone Encounter (Signed)
To Dr Conley RollsLe, looks like sensitivity is in

## 2014-05-06 ENCOUNTER — Encounter: Payer: Self-pay | Admitting: Podiatry

## 2014-05-06 ENCOUNTER — Ambulatory Visit (INDEPENDENT_AMBULATORY_CARE_PROVIDER_SITE_OTHER): Payer: BC Managed Care – PPO | Admitting: Family Medicine

## 2014-05-06 ENCOUNTER — Encounter: Payer: Self-pay | Admitting: Family Medicine

## 2014-05-06 VITALS — BP 110/72 | HR 70 | Temp 97.8°F | Resp 18 | Wt 147.0 lb

## 2014-05-06 DIAGNOSIS — S66911S Strain of unspecified muscle, fascia and tendon at wrist and hand level, right hand, sequela: Secondary | ICD-10-CM

## 2014-05-06 DIAGNOSIS — K219 Gastro-esophageal reflux disease without esophagitis: Secondary | ICD-10-CM

## 2014-05-06 DIAGNOSIS — S66811S Strain of other specified muscles, fascia and tendons at wrist and hand level, right hand, sequela: Secondary | ICD-10-CM

## 2014-05-06 DIAGNOSIS — R300 Dysuria: Secondary | ICD-10-CM

## 2014-05-06 DIAGNOSIS — T7840XA Allergy, unspecified, initial encounter: Secondary | ICD-10-CM

## 2014-05-06 LAB — POCT URINALYSIS DIPSTICK
Bilirubin, UA: NEGATIVE
Blood, UA: NEGATIVE
Glucose, UA: NEGATIVE
Ketones, UA: NEGATIVE
Leukocytes, UA: NEGATIVE
Nitrite, UA: NEGATIVE
Protein, UA: NEGATIVE
Spec Grav, UA: 1.01
Urobilinogen, UA: 0.2
pH, UA: 7

## 2014-05-06 LAB — POCT UA - MICROSCOPIC ONLY
Bacteria, U Microscopic: NEGATIVE
Casts, Ur, LPF, POC: NEGATIVE
Crystals, Ur, HPF, POC: NEGATIVE
Mucus, UA: NEGATIVE
RBC, urine, microscopic: NEGATIVE
WBC, Ur, HPF, POC: NEGATIVE
Yeast, UA: NEGATIVE

## 2014-05-06 MED ORDER — RANITIDINE HCL 300 MG PO TABS: 300.0000 mg | ORAL_TABLET | Freq: Every day | ORAL | Status: DC

## 2014-05-06 MED ORDER — PREDNISONE 20 MG PO TABS: 20.0000 mg | ORAL_TABLET | Freq: Two times a day (BID) | ORAL | Status: DC

## 2014-05-06 NOTE — Progress Notes (Signed)
Subjective:  This chart was scribed for Rachael Acosta by Haywood PaoNadim Abu Hashem, ED Scribe. The patient was seen in Exam room 08 and the patient's care was started at 4:14 PM.   Patient ID: Rachael Acosta, female    DOB: 05/02/1966, 48 y.o.   MRN: [id number]  HPI HPI Comments: Rachael Acosta is a 48 y.o. female who [facility] complaining of an allergic reaction. She notes she has had some chest palpitations, itching and burning as associated symptoms. She started taking her Keflex yesterday for a UTI. She has some burning as an associated symptom of her UTI and believes that is what cause her allergic reaction. She also has a right ring finger injury. She was supposed to have surgery for both her feet but she is unable to because of her UTI. Pt also says she has a history of GERD which is causing her dysphagia.  Patient Active Problem List   Diagnosis Date Noted  . Back pain 02/03/2013  . AR (allergic rhinitis) 09/17/2011   Past Medical History  Diagnosis Date  . Allergy    Past Surgical History  Procedure Laterality Date  . Breast enhancement surgery    . Breast surgery    . Eye surgery     Allergies  Allergen Reactions  . Keflex [Cephalexin] Itching and Palpitations   Prior to Admission medications   Medication Sig Start Date End Date Taking? Authorizing Provider  b complex vitamins tablet Take 1 tablet by mouth daily.   Yes Historical Provider, MD  cetirizine (ZYRTEC) 10 MG tablet Take 1 tablet (10 mg total) by mouth daily. 10/04/13  Yes Tonye Pearsonobert P Doolittle, MD  Cholecalciferol (VITAMIN D3 PO) Take by mouth.   Yes Historical Provider, MD  fluocinonide-emollient (LIDEX-E) 0.05 % cream Apply topically 2 (two) times daily. 04/27/13  Yes Tonye Pearsonobert P Doolittle, MD  fluticasone Aleda Grana(FLONASE) 50 MCG/ACT nasal spray instill 1 spray into each nostril twice a day 10/04/13  Yes Tonye Pearsonobert P Doolittle, MD  Multiple Vitamin (MULTIVITAMIN) tablet Take 1 tablet by mouth daily.   Yes Historical Provider, MD    oxyCODONE-acetaminophen (PERCOCET) 10-325 MG per tablet Take one to two tablets by mouth every six to eight hours as needed for pain. 05/03/14  Yes Max T Hyatt, DPM  promethazine (PHENERGAN) 25 MG tablet Take 1 tablet (25 mg total) by mouth every 8 (eight) hours as needed for nausea or vomiting. 05/03/14  Yes Max T Hyatt, DPM  traMADol (ULTRAM) 50 MG tablet  01/28/14  Yes Historical Provider, MD  zolpidem (AMBIEN) 10 MG tablet TAKE ONE-HALF TABLET BY MOUTH AT BEDTIME AS NEEDED FOR SLEEP   Yes Tonye Pearsonobert P Doolittle, MD  cephALEXin (KEFLEX) 500 MG capsule Take 1 capsule (500 mg total) by mouth 2 (two) times daily. 05/05/14   Thao P Le, DO  ciprofloxacin (CIPRO) 500 MG tablet Take 1 tablet (500 mg total) by mouth 2 (two) times daily. 05/04/14   Thao P Le, DO   History   Social History  . Marital Status: Married    Spouse Name: N/A    Number of Children: N/A  . Years of Education: N/A   Occupational History  . Not on file.   Social History Main Topics  . Smoking status: Never Smoker   . Smokeless tobacco: Not on file  . Alcohol Use: 0.5 oz/week    1 drink(s) per week     Comment: occassionally  . Drug Use: No  . Sexual Activity: Yes   Other  Topics Concern  . Not on file   Social History Narrative   2 pregnancines   2 live births    Review of Systems  Cardiovascular: Positive for palpitations.  Skin: Positive for rash.  All other systems reviewed and are negative.      Objective:   Physical Exam  Nursing note and vitals reviewed. Constitutional: She is oriented to person, place, and time. She appears well-developed and well-nourished. No distress.  HENT:  Head: Normocephalic and atraumatic.  Eyes: EOM are normal.  Neck: Normal range of motion.  Cardiovascular: Normal rate.   Pulmonary/Chest: Effort normal.  Musculoskeletal: Normal range of motion.  Neurological: She is alert and oriented to person, place, and time.  Skin: Skin is warm and dry.  Mild erythema diffusely  on arms with excoriations   Psychiatric: She has a normal mood and affect. Her behavior is normal.    BP 110/72  Pulse 70  Temp(Src) 97.8 F (36.6 C) (Oral)  Resp 18  Wt 147 lb (66.679 kg)  SpO2 100%  LMP 04/26/2014       Assessment & Plan:  I personally performed the services described in this documentation, which was scribed in my presence. The recorded information has been reviewed and is accurate.  Dysuria - Plan: POCT urinalysis dipstick, POCT UA - Microscopic Only  Allergic reaction, initial encounter - Plan: predniSONE (DELTASONE) 20 MG tablet  Gastroesophageal reflux disease without esophagitis - Plan: ranitidine (ZANTAC) 300 MG tablet  Extensor tendon rupture of hand, right, sequela  Signed, Rachael SidleKurt Nelani Schmelzle, MD

## 2014-05-07 ENCOUNTER — Telehealth: Payer: Self-pay

## 2014-05-07 NOTE — Telephone Encounter (Signed)
Patient called - wants to know why she was prescribed prednisone. Please return call and advise. CB # [phone]

## 2014-05-08 NOTE — Telephone Encounter (Signed)
Pt notified that prednisone was for allergic reaction

## 2014-05-11 ENCOUNTER — Telehealth: Payer: Self-pay | Admitting: *Deleted

## 2014-05-11 NOTE — Telephone Encounter (Signed)
I called the patient to see if she intended to have surgery on Friday.  She stated she did.  I asked her if she saw a doctor about her urinary tract infection.  She stated, "I went to Urgent Care and saw Dr. Milus GlazierLauenstein.  He said everything is clear."  I asked her if she could get him to send a letter to the surgical center to let them know.  She stated, "I will what's your fax number.  I want to get everything taken care of this week because if not I will not be doing it at all."  I gave her the fax number for [name] which is [phone].  I called and informed [name] that she saw Dr. Milus GlazierLauenstein at Urgent Care.  She stated as long as she's on an antibiotic she should be good for Friday.  I called and informed the patient that she doesn't need to get a letter sent to surgical center.  She stated, "Oh okay, I received a call from you all about an appointment for tomorrow."  I told her not to worry about that, we will cancel it.  It was a post operative appointment.    I asked [name] to reschedule the post operative appointment to next week.

## 2014-05-12 ENCOUNTER — Encounter: Payer: BC Managed Care – PPO | Admitting: Podiatry

## 2014-05-13 DIAGNOSIS — M2010 Hallux valgus (acquired), unspecified foot: Secondary | ICD-10-CM

## 2014-05-14 ENCOUNTER — Telehealth: Payer: Self-pay

## 2014-05-14 NOTE — Telephone Encounter (Signed)
Spoke with patient regarding post operative status. She stated that she was in a lot pain and taking her pain medication as scheduled, stated that it provided some relief. Advised patient to take 400-600mg  of ibuprofen in between scheduled doses of her pain medications to help alleviate pain. Advised to ice and elevate, remain in boot and keep dressing dry and intact. Advised on signs and symptoms of infection and to be seen immediately if such should occur.

## 2014-05-19 ENCOUNTER — Ambulatory Visit (INDEPENDENT_AMBULATORY_CARE_PROVIDER_SITE_OTHER): Payer: BC Managed Care – PPO | Admitting: Podiatry

## 2014-05-19 ENCOUNTER — Encounter: Payer: Self-pay | Admitting: Podiatry

## 2014-05-19 ENCOUNTER — Ambulatory Visit (INDEPENDENT_AMBULATORY_CARE_PROVIDER_SITE_OTHER): Payer: BC Managed Care – PPO

## 2014-05-19 VITALS — BP 116/62 | HR 88 | Temp 97.5°F | Resp 16

## 2014-05-19 DIAGNOSIS — M2010 Hallux valgus (acquired), unspecified foot: Secondary | ICD-10-CM

## 2014-05-19 DIAGNOSIS — Z9889 Other specified postprocedural states: Secondary | ICD-10-CM

## 2014-05-20 NOTE — Progress Notes (Signed)
She presents today for a follow-up of her bilateral bunion procedures. [facility] is [facility] K wire fixation 1 week.  Objective: Vital signs are stable she is alert and oriented 3. Dry sterile dressing intact with Cam Walker's. Once removed demonstrates minimal erythema no edema cellulitis drainage or odor. Sutures are in place margins are well coapted and she has a good range of motion of the first metatarsophalangeal joints bilaterally. Radiographs confirmed good placement slightly overcorrected first metatarsophalangeal joint right foot but I do believe this will start to lateralize and she heals.  Assessment: Well-healing [place] bunion repair bilateral.  Plan: Discussed etiology pathology conservative versus surgical therapies. Redressed today with a dry sterile compressive dressing placed her in bilateral Cam Walker's A she will continue to keep these dry stay off of it as much as possible and keep it elevated. I will follow up with her in 1 week in which time we will discuss returning to work.

## 2014-05-22 NOTE — Progress Notes (Signed)
Dr Al CorpusHyatt performed a bilateral Austin bunion repair on 05/06/14

## 2014-05-26 ENCOUNTER — Encounter: Payer: Self-pay | Admitting: Podiatry

## 2014-05-26 ENCOUNTER — Ambulatory Visit (INDEPENDENT_AMBULATORY_CARE_PROVIDER_SITE_OTHER): Payer: BC Managed Care – PPO | Admitting: Podiatry

## 2014-05-26 VITALS — BP 112/71 | HR 69 | Resp 11

## 2014-05-26 DIAGNOSIS — Z9889 Other specified postprocedural states: Secondary | ICD-10-CM

## 2014-05-27 NOTE — Progress Notes (Signed)
She presents today 2 weeks status post [place] bunion repair bilateral. She denies fever chills nausea vomiting muscle aches and pains.  Objective: Vital signs are stable she is alert and oriented 3. Pulses are strongly palpable bilateral dry sterile dressing was removed demonstrates margins well coapted sutures are intact which were removed today.  Assessment: Well-healing surgical foot bilateral status post 2 weeks Austin bunion repair with pin fixation  Plan:encouraged range of motion exercises. Placed her in anklets bilateral and dark or shoes bilateral I will allow her to start getting this wet washing it and putting lotion on it. I will follow-up with her in 2 weeks.

## 2014-06-07 ENCOUNTER — Encounter: Payer: Self-pay | Admitting: Podiatry

## 2014-06-07 ENCOUNTER — Ambulatory Visit (INDEPENDENT_AMBULATORY_CARE_PROVIDER_SITE_OTHER): Payer: BC Managed Care – PPO | Admitting: Podiatry

## 2014-06-07 ENCOUNTER — Ambulatory Visit (INDEPENDENT_AMBULATORY_CARE_PROVIDER_SITE_OTHER): Payer: BC Managed Care – PPO

## 2014-06-07 VITALS — BP 116/62 | HR 88 | Resp 16

## 2014-06-07 DIAGNOSIS — Z9889 Other specified postprocedural states: Secondary | ICD-10-CM

## 2014-06-07 DIAGNOSIS — M2010 Hallux valgus (acquired), unspecified foot: Secondary | ICD-10-CM

## 2014-06-07 NOTE — Progress Notes (Signed)
Rachael Acosta presents today several weeks status post [place] bunionectomy repair bilateral. She states that she is already back to work in her feet seem to be doing okay. She continues to wear the compression dressing daily.  Objective: Vital signs are stable she's alert and oriented 3 there is no erythema is mild edema margins are well coapted sutures are intact. She has good range of motion at the toes but she could have better with exercise. Radiographic evaluation confirms slight tilt to the head of the first metatarsal osteotomy right foot however the left one is nice and rectus.  Assessment: Status post [place] bunion repair bilateral 6 weeks.  Plan: I encouraged her to get back into a pair of tennis shoes and I will follow-up with her in 2 weeks

## 2014-06-21 ENCOUNTER — Ambulatory Visit (INDEPENDENT_AMBULATORY_CARE_PROVIDER_SITE_OTHER): Payer: BC Managed Care – PPO

## 2014-06-21 ENCOUNTER — Ambulatory Visit (INDEPENDENT_AMBULATORY_CARE_PROVIDER_SITE_OTHER): Payer: BC Managed Care – PPO | Admitting: Podiatry

## 2014-06-21 VITALS — BP 133/63 | HR 75 | Resp 16

## 2014-06-21 DIAGNOSIS — M2010 Hallux valgus (acquired), unspecified foot: Secondary | ICD-10-CM

## 2014-06-21 DIAGNOSIS — Z9889 Other specified postprocedural states: Secondary | ICD-10-CM

## 2014-06-21 NOTE — Progress Notes (Signed)
She presents today 6 weeks status post [place] bunion repair bilateral. She states that she still has some pain maybe about a 4 out of 10. Some soreness but she continues to work daily.  Objective: Vital signs are stable she's alert and oriented 3. Pulses are palpable bilateral. Much decreased in edema bilateral. She has good range of motion of the first metatarsophalangeal joints bilateral. No pain on palpation. Radiographic evaluation does demonstrate well-healing first metatarsal osteotomy with pin fixation. No displacement from previous radiographs bilaterally.  Assessment: Well-healing surgical bunion corrections bilateral.  Plan: Get back into her regular shoe gear and she may return to the gym in 2 weeks. I will follow-up with her in 1 month for another set of x-rays

## 2014-06-27 ENCOUNTER — Ambulatory Visit: Payer: BC Managed Care – PPO | Admitting: Radiology

## 2014-06-27 ENCOUNTER — Ambulatory Visit (INDEPENDENT_AMBULATORY_CARE_PROVIDER_SITE_OTHER): Payer: BC Managed Care – PPO | Admitting: Radiology

## 2014-06-27 DIAGNOSIS — Z23 Encounter for immunization: Secondary | ICD-10-CM

## 2014-07-19 ENCOUNTER — Encounter: Payer: BC Managed Care – PPO | Admitting: Podiatry

## 2014-07-28 ENCOUNTER — Ambulatory Visit (INDEPENDENT_AMBULATORY_CARE_PROVIDER_SITE_OTHER): Payer: BLUE CROSS/BLUE SHIELD | Admitting: Podiatry

## 2014-07-28 ENCOUNTER — Ambulatory Visit (INDEPENDENT_AMBULATORY_CARE_PROVIDER_SITE_OTHER): Payer: BLUE CROSS/BLUE SHIELD

## 2014-07-28 VITALS — BP 115/75 | HR 79 | Resp 16

## 2014-07-28 DIAGNOSIS — Z9889 Other specified postprocedural states: Secondary | ICD-10-CM

## 2014-07-28 DIAGNOSIS — M2010 Hallux valgus (acquired), unspecified foot: Secondary | ICD-10-CM

## 2014-07-29 NOTE — Progress Notes (Signed)
She presents today for her final follow-up visit [facility] foot. She states that she is doing very well. She has pain occasionally after standing on her feet all day.  Objective: Vital signs are stable she is alert and oriented 3 pulses are palpable bilateral. She has good range of motion of the first metatarsophalangeal joints bilaterally and her incisions appear to be healing very well. Radiographic evaluation confirms good alignment of both I osteotomies which have gone on to heal completely.  Assessment: Well-healing surgical metatarsal osteotomies bilateral.  Plan: Follow up with me as needed.

## 2014-08-29 ENCOUNTER — Ambulatory Visit (INDEPENDENT_AMBULATORY_CARE_PROVIDER_SITE_OTHER): Payer: BLUE CROSS/BLUE SHIELD | Admitting: Physician Assistant

## 2014-08-29 VITALS — BP 124/74 | HR 75 | Temp 97.5°F | Resp 17 | Ht 64.5 in | Wt 147.0 lb

## 2014-08-29 DIAGNOSIS — N390 Urinary tract infection, site not specified: Secondary | ICD-10-CM

## 2014-08-29 DIAGNOSIS — M5431 Sciatica, right side: Secondary | ICD-10-CM

## 2014-08-29 DIAGNOSIS — Z23 Encounter for immunization: Secondary | ICD-10-CM

## 2014-08-29 DIAGNOSIS — R300 Dysuria: Secondary | ICD-10-CM

## 2014-08-29 LAB — POCT UA - MICROSCOPIC ONLY
AMORPHOUS: POSITIVE
Casts, Ur, LPF, POC: NEGATIVE
Crystals, Ur, HPF, POC: NEGATIVE
Mucus, UA: NEGATIVE
RBC, urine, microscopic: NEGATIVE
WBC, UR, HPF, POC: NEGATIVE
Yeast, UA: NEGATIVE

## 2014-08-29 LAB — POCT URINALYSIS DIPSTICK
Bilirubin, UA: NEGATIVE
GLUCOSE UA: NEGATIVE
Ketones, UA: NEGATIVE
Nitrite, UA: NEGATIVE
Protein, UA: NEGATIVE
RBC UA: NEGATIVE
SPEC GRAV UA: 1.015
Urobilinogen, UA: 0.2
pH, UA: 7

## 2014-08-29 MED ORDER — CIPROFLOXACIN HCL 500 MG PO TABS: 500.0000 mg | ORAL_TABLET | Freq: Two times a day (BID) | ORAL | Status: DC

## 2014-08-29 MED ORDER — CYCLOBENZAPRINE HCL 5 MG PO TABS: 5.0000 mg | ORAL_TABLET | Freq: Three times a day (TID) | ORAL | Status: DC | PRN

## 2014-08-29 NOTE — Progress Notes (Signed)
Subjective:    Patient ID: Rachael Acosta, female    DOB: 12/29/1965, 49 y.o.   MRN: [id number]  HPI  This is a 49 year old female presenting with several complaints.  Pt is complaining of dysuria, urinary frequency and lower abdominal pressure x 5 days. Pt has a history of recurrent UTIs. She has been prescribed cipro 500 mg to take when she feels a UTI coming on. She has taken 500 mg cipro QD for past 4 days. She feels it is not helping. She denies new back pain, fever, chills, N/V .Last UTI was 2 months ago. Has cipro 500 mg and taken 4 doses. She is sexually active with one female partner (same partner for past 1 year). She is not concerned for STDs. She is not constipated. LMP 08/15/14. No vaginal symptoms.  Sciatica: She reports low right sided back pain with right sciatica to foot for many years. She reports it never goes away, some days worse than others. Usually takes 2 aleve per day and helps some. Has taken mobic before and not helpful. Wanting to try a muscle relaxer, states she has never tried one before but a friends recently told her it might be helpful. Sees Dr. Yetta BarreJones and a chiropractor for her back pain. Has had epidural shots before but states they only helped some.  Pt is needed a Tdap booster. She states last Tdap was over 10 years ago. She is going to be around an infant soon and wanted to be up to date.  Review of Systems  Constitutional: Negative for fever and chills.  Gastrointestinal: Positive for abdominal pain. Negative for nausea, vomiting and constipation.  Genitourinary: Positive for dysuria and frequency. Negative for vaginal discharge, difficulty urinating and menstrual problem.  Musculoskeletal: Positive for back pain. Negative for gait problem.  Skin: Negative for rash.  Neurological: Negative for weakness and numbness.    Patient Active Problem List   Diagnosis Date Noted  . Back pain 02/03/2013  . AR (allergic rhinitis) 09/17/2011   Prior to Admission  medications   Medication Sig Start Date End Date Taking? Authorizing Provider  b complex vitamins tablet Take 1 tablet by mouth daily.   Yes Historical Provider, MD  cetirizine (ZYRTEC) 10 MG tablet Take 1 tablet (10 mg total) by mouth daily. 10/04/13  Yes Tonye Pearsonobert P Doolittle, MD  Cholecalciferol (VITAMIN D3 PO) Take by mouth.   Yes Historical Provider, MD  fluticasone (FLONASE) 50 MCG/ACT nasal spray instill 1 spray into each nostril twice a day 10/04/13  Yes Tonye Pearsonobert P Doolittle, MD  Multiple Vitamin (MULTIVITAMIN) tablet Take 1 tablet by mouth daily.   Yes Historical Provider, MD   Allergies  Allergen Reactions  . Keflex [Cephalexin] Itching and Palpitations   Patient's social and family history were reviewed.     Objective:   Physical Exam  Constitutional: She is oriented to person, place, and time. She appears well-developed and well-nourished. No distress.  HENT:  Head: Normocephalic and atraumatic.  Right Ear: Hearing normal.  Left Ear: Hearing normal.  Nose: Nose normal.  Eyes: Conjunctivae and lids are normal. Right eye exhibits no discharge. Left eye exhibits no discharge. No scleral icterus.  Cardiovascular: Normal rate, regular rhythm, normal heart sounds, intact distal pulses and normal pulses.   No murmur heard. Pulmonary/Chest: Effort normal and breath sounds normal. No respiratory distress. She has no wheezes. She has no rhonchi. She has no rales.  Abdominal: Soft. Normal appearance. There is tenderness in the suprapubic area. There  is no CVA tenderness.  Musculoskeletal: Normal range of motion.       Lumbar back: She exhibits tenderness (right paraspinal). She exhibits normal range of motion and no bony tenderness.  Neurological: She is alert and oriented to person, place, and time. She has normal strength and normal reflexes. No sensory deficit. Coordination and gait normal.  Skin: Skin is warm, dry and intact. No lesion and no rash noted.  Psychiatric: She has a normal  mood and affect. Her speech is normal and behavior is normal. Thought content normal.   BP 124/74 mmHg  Pulse 75  Temp(Src) 97.5 F (36.4 C) (Oral)  Resp 17  Ht 5' 4.5" (1.638 m)  Wt 147 lb (66.679 kg)  BMI 24.85 kg/m2  SpO2 100%  LMP 08/15/2014  Results for orders placed or performed in visit on 08/29/14  POCT UA - Microscopic Only  Result Value Ref Range   WBC, Ur, HPF, POC 1-2 neg    RBC, urine, microscopic neg    Bacteria, U Microscopic trace    Mucus, UA neg    Epithelial cells, urine per micros 0-1    Crystals, Ur, HPF, POC neg    Casts, Ur, LPF, POC neg    Yeast, UA neg    Amorphous positive    Renal tubular cells 1-2   POCT urinalysis dipstick  Result Value Ref Range   Color, UA yellow    Clarity, UA clear    Glucose, UA neg    Bilirubin, UA neg    Ketones, UA neg    Spec Grav, UA 1.015    Blood, UA neg    pH, UA 7.0    Protein, UA neg    Urobilinogen, UA 0.2    Nitrite, UA neg    Leukocytes, UA Trace       Assessment & Plan:  1. Dysuria 2. Recurrent UTI UA normal. Gave 5 day course of cipro 500 mg to take BID since she is symptomatic. UA possibly normal d/t abx treatment thus far. Urine culture pending. G/C pending. Will follow up with pt in 3-5 days to see if symptoms have resolved.  - POCT UA - Microscopic Only - POCT urinalysis dipstick - Urine culture - ciprofloxacin (CIPRO) 500 MG tablet; Take 1 tablet (500 mg total) by mouth 2 (two) times daily.  Dispense: 10 tablet; Refill: 0 - GC/Chlamydia Probe Amp  3. Need for Tdap vaccination - Tdap vaccine greater than or equal to 7yo IM  4. Sciatica, right Gave pt a trial of flexeril to see how this does for her. Advised she follow up with Dr. Yetta Barre for further management. - cyclobenzaprine (FLEXERIL) 5 MG tablet; Take 1 tablet (5 mg total) by mouth 3 (three) times daily as needed for muscle spasms.  Dispense: 30 tablet; Refill: 0   [place] [employer]. Dyke Brackett, MHS [facility]  08/30/2014

## 2014-08-29 NOTE — Patient Instructions (Signed)
Start taking cipro twice a day for next 3 days.  Will call you with results of cipro. Take miralax if you are having problems with constipation. May take flexeril three times a day if it doesn't make you drowsy. If this works for you, follow up with Dr. Yetta BarreJones for management. Drink plenty of water and start taking a cranberry pill daily.

## 2014-08-30 DIAGNOSIS — N390 Urinary tract infection, site not specified: Secondary | ICD-10-CM | POA: Insufficient documentation

## 2014-08-31 LAB — URINE CULTURE: Colony Count: >=100000

## 2014-08-31 LAB — GC/CHLAMYDIA PROBE AMP
CT PROBE, AMP APTIMA: NEGATIVE
GC PROBE AMP APTIMA: NEGATIVE

## 2014-10-28 ENCOUNTER — Other Ambulatory Visit: Payer: Self-pay | Admitting: Internal Medicine

## 2014-11-11 ENCOUNTER — Other Ambulatory Visit: Payer: Self-pay | Admitting: Internal Medicine

## 2014-11-15 ENCOUNTER — Telehealth: Payer: Self-pay | Admitting: Radiology

## 2014-11-15 NOTE — Telephone Encounter (Signed)
Called in rx and notified pt via voicemail. 

## 2014-11-20 ENCOUNTER — Other Ambulatory Visit: Payer: Self-pay | Admitting: Physician Assistant

## 2014-12-21 ENCOUNTER — Other Ambulatory Visit: Payer: Self-pay | Admitting: Physician Assistant

## 2014-12-26 IMAGING — CR DG LUMBAR SPINE COMPLETE 4+V
5 series · 5 of 5 positions shown · non-contrast
Comparison: None.

CLINICAL DATA: Back pain radiating to the right lower extremity

EXAM:
LUMBAR SPINE - COMPLETE 4+ VIEW

[AP]
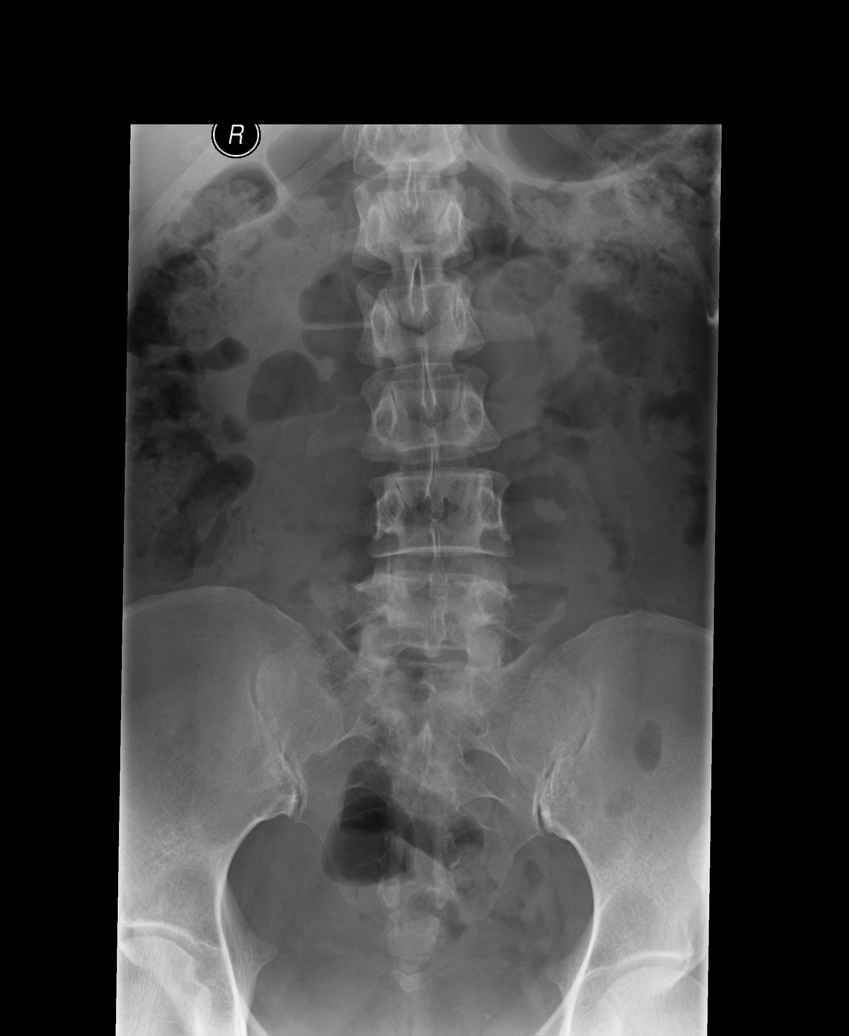

[rpo]
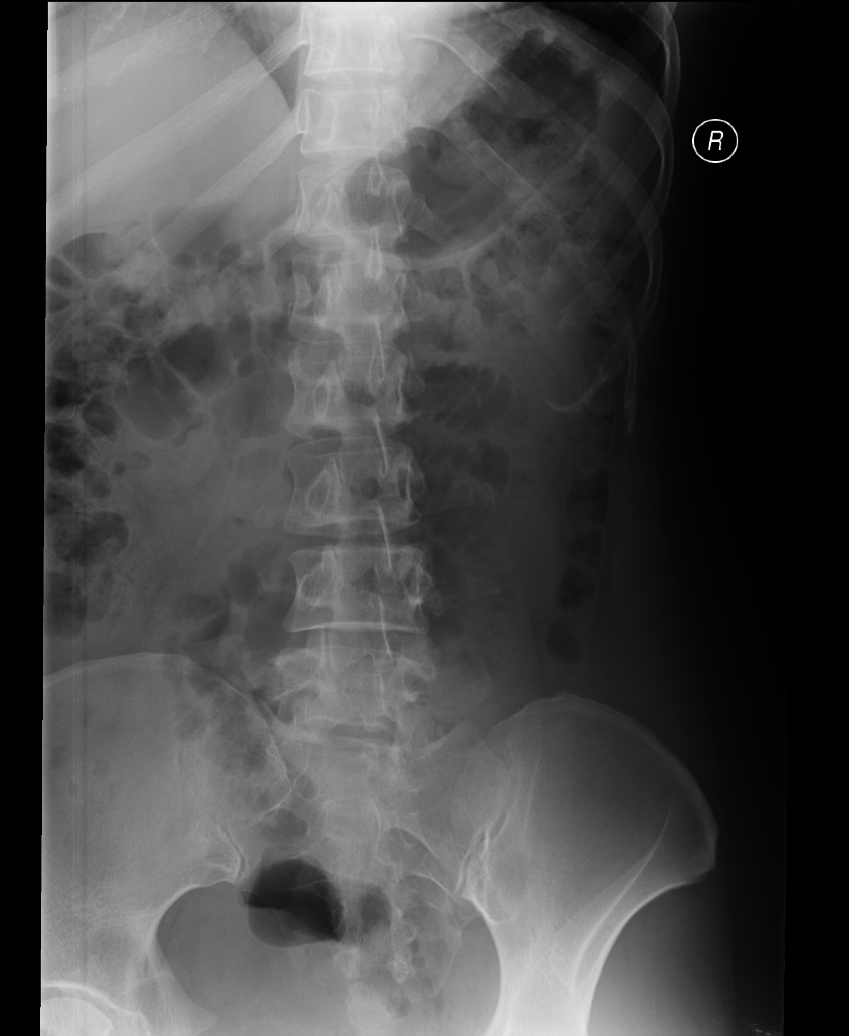

[lpo]
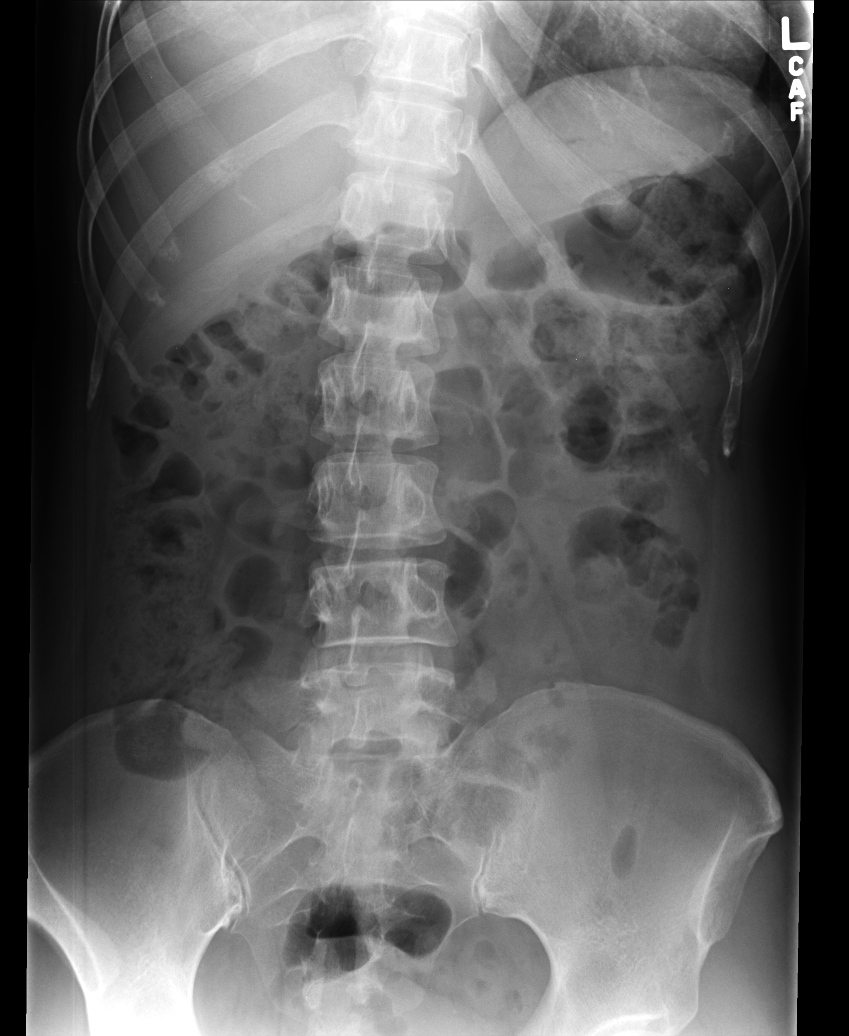

[lateral]
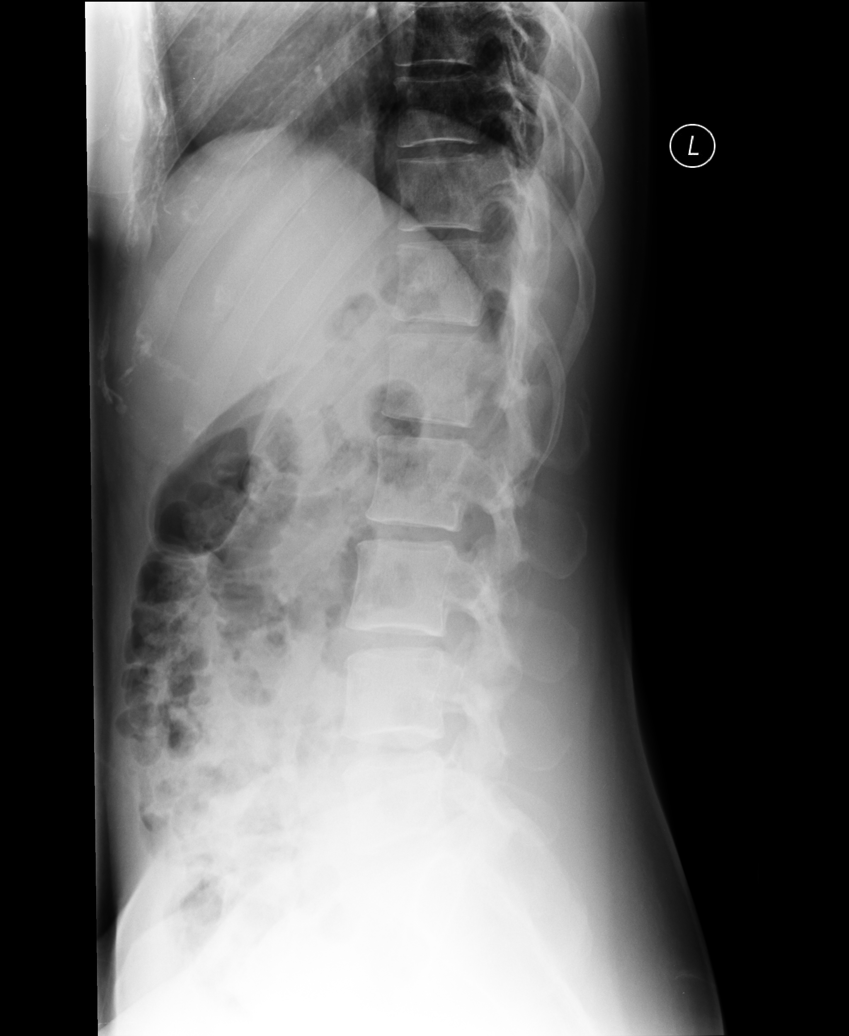

[l5 s1]
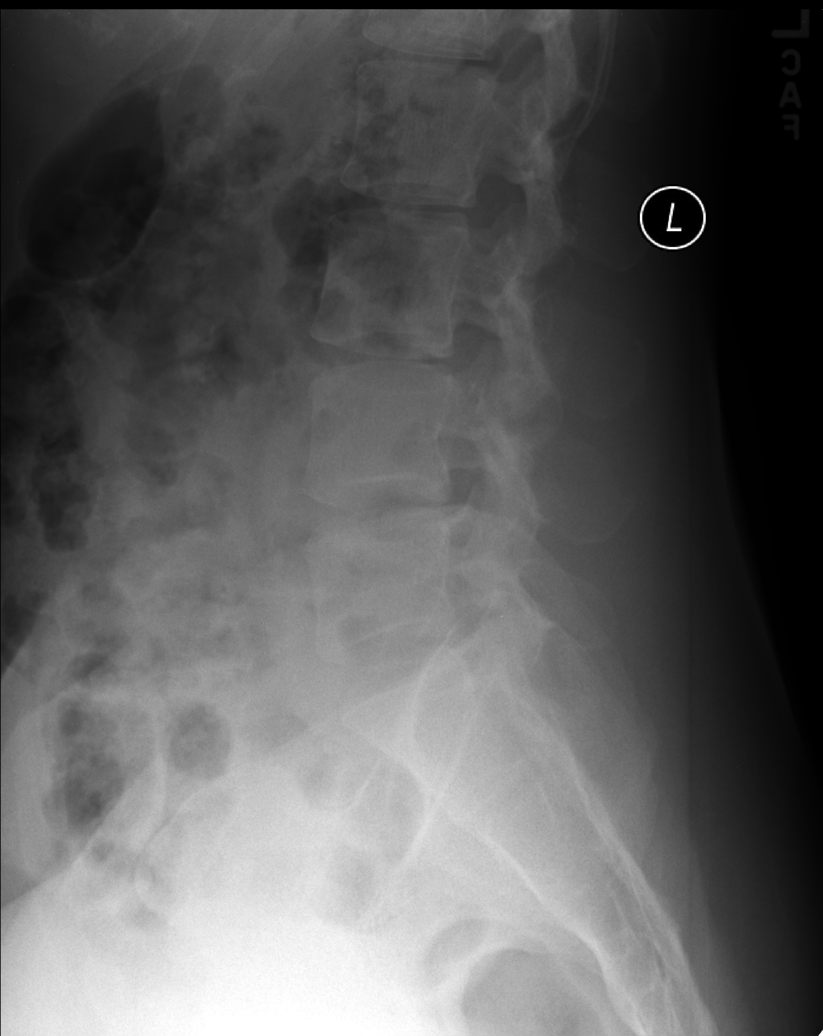

[5 of 5 positions shown; findings below may reference images not displayed]

FINDINGS: There is no evidence of lumbar spine fracture. There is scoliosis of
spine. The vertebral body heights are normal. Intervertebral disc
spaces are maintained.
IMPRESSION: No acute fracture or dislocation.  Scoliosis of spine.

## 2014-12-28 ENCOUNTER — Other Ambulatory Visit: Payer: Self-pay | Admitting: Internal Medicine

## 2015-01-02 ENCOUNTER — Telehealth: Payer: Self-pay

## 2015-01-02 NOTE — Telephone Encounter (Signed)
Rx for Ambien faxed

## 2015-02-03 ENCOUNTER — Ambulatory Visit (INDEPENDENT_AMBULATORY_CARE_PROVIDER_SITE_OTHER): Payer: BLUE CROSS/BLUE SHIELD | Admitting: Urgent Care

## 2015-02-03 VITALS — BP 110/84 | HR 77 | Temp 99.0°F | Resp 16 | Ht 64.5 in | Wt 147.0 lb

## 2015-02-03 DIAGNOSIS — H6982 Other specified disorders of Eustachian tube, left ear: Secondary | ICD-10-CM

## 2015-02-03 DIAGNOSIS — R300 Dysuria: Secondary | ICD-10-CM | POA: Diagnosis not present

## 2015-02-03 DIAGNOSIS — R350 Frequency of micturition: Secondary | ICD-10-CM | POA: Diagnosis not present

## 2015-02-03 DIAGNOSIS — J302 Other seasonal allergic rhinitis: Secondary | ICD-10-CM

## 2015-02-03 DIAGNOSIS — N3090 Cystitis, unspecified without hematuria: Secondary | ICD-10-CM | POA: Insufficient documentation

## 2015-02-03 LAB — POCT URINALYSIS DIPSTICK
Bilirubin, UA: NEGATIVE
GLUCOSE UA: NEGATIVE
Ketones, UA: NEGATIVE
Nitrite, UA: NEGATIVE
PH UA: 7
Protein, UA: NEGATIVE
RBC UA: NEGATIVE
Spec Grav, UA: 1.015
Urobilinogen, UA: 0.2

## 2015-02-03 LAB — POCT UA - MICROSCOPIC ONLY
Bacteria, U Microscopic: NEGATIVE
CASTS, UR, LPF, POC: NEGATIVE
Crystals, Ur, HPF, POC: NEGATIVE
Mucus, UA: NEGATIVE
RBC, URINE, MICROSCOPIC: 0
Yeast, UA: NEGATIVE

## 2015-02-03 MED ORDER — SULFAMETHOXAZOLE-TRIMETHOPRIM 800-160 MG PO TABS: 1.0000 | ORAL_TABLET | Freq: Two times a day (BID) | ORAL | Status: DC

## 2015-02-03 NOTE — Patient Instructions (Signed)
Urinary Tract Infection Urinary tract infections (UTIs) can develop anywhere along your urinary tract. Your urinary tract is your body's drainage system for removing wastes and extra water. Your urinary tract includes two kidneys, two ureters, a bladder, and a urethra. Your kidneys are a pair of bean-shaped organs. Each kidney is about the size of your fist. They are located below your ribs, one on each side of your spine. CAUSES Infections are caused by microbes, which are microscopic organisms, including fungi, viruses, and bacteria. These organisms are so small that they can only be seen through a microscope. Bacteria are the microbes that most commonly cause UTIs. SYMPTOMS  Symptoms of UTIs may vary by age and gender of the patient and by the location of the infection. Symptoms in young women typically include a frequent and intense urge to urinate and a painful, burning feeling in the bladder or urethra during urination. Older women and men are more likely to be tired, shaky, and weak and have muscle aches and abdominal pain. A fever may mean the infection is in your kidneys. Other symptoms of a kidney infection include pain in your back or sides below the ribs, nausea, and vomiting. DIAGNOSIS To diagnose a UTI, your caregiver will ask you about your symptoms. Your caregiver also will ask to provide a urine sample. The urine sample will be tested for bacteria and white blood cells. White blood cells are made by your body to help fight infection. TREATMENT  Typically, UTIs can be treated with medication. Because most UTIs are caused by a bacterial infection, they usually can be treated with the use of antibiotics. The choice of antibiotic and length of treatment depend on your symptoms and the type of bacteria causing your infection. HOME CARE INSTRUCTIONS  If you were prescribed antibiotics, take them exactly as your caregiver instructs you. Finish the medication even if you feel better after you  have only taken some of the medication.  Drink enough water and fluids to keep your urine clear or pale yellow.  Avoid caffeine, tea, and carbonated beverages. They tend to irritate your bladder.  Empty your bladder often. Avoid holding urine for long periods of time.  Empty your bladder before and after sexual intercourse.  After a bowel movement, women should cleanse from front to back. Use each tissue only once. SEEK MEDICAL CARE IF:   You have back pain.  You develop a fever.  Your symptoms do not begin to resolve within 3 days. SEEK IMMEDIATE MEDICAL CARE IF:   You have severe back pain or lower abdominal pain.  You develop chills.  You have nausea or vomiting.  You have continued burning or discomfort with urination. MAKE SURE YOU:   Understand these instructions.  Will watch your condition.  Will get help right away if you are not doing well or get worse. Document Released: 04/10/2005 Document Revised: 12/31/2011 Document Reviewed: 08/09/2011 [facility] Patient Information 2015 [place], [place]. This information is not intended to replace advice given to you by your health care provider. Make sure you discuss any questions you have with your health care provider.   Barotitis Media Barotitis media is inflammation of your middle ear. This occurs when the auditory tube (eustachian tube) leading from the back of your nose (nasopharynx) to your eardrum is blocked. This blockage may result from a cold, environmental allergies, or an upper respiratory infection. Unresolved barotitis media may lead to damage or hearing loss (barotrauma), which may become permanent. HOME CARE INSTRUCTIONS  Use medicines as recommended by your health care provider. Over-the-counter medicines will help unblock the canal and can help during times of air travel.  Do not put anything into your ears to clean or unplug them. Eardrops will not be helpful.  Do not swim, dive, or fly until your health  care provider says it is all right to do so. If these activities are necessary, chewing gum with frequent, forceful swallowing may help. It is also helpful to hold your nose and gently blow to pop your ears for equalizing pressure changes. This forces air into the eustachian tube.  Only take over-the-counter or prescription medicines for pain, discomfort, or fever as directed by your health care provider.  A decongestant may be helpful in decongesting the middle ear and make pressure equalization easier. SEEK MEDICAL CARE IF:  You experience a serious form of dizziness in which you feel as if the room is spinning and you feel nauseated (vertigo).  Your symptoms only involve one ear. SEEK IMMEDIATE MEDICAL CARE IF:   You develop a severe headache, dizziness, or severe ear pain.  You have bloody or pus-like drainage from your ears.  You develop a fever.  Your problems do not improve or become worse. MAKE SURE YOU:   Understand these instructions.  Will watch your condition.  Will get help right away if you are not doing well or get worse. Document Released: 06/28/2000 Document Revised: 04/21/2013 Document Reviewed: 01/26/2013 [facility] Patient Information 2015 [place], [place]. This information is not intended to replace advice given to you by your health care provider. Make sure you discuss any questions you have with your health care provider.

## 2015-02-03 NOTE — Progress Notes (Signed)
MRN: [id number] DOB: September 16, 1965  Subjective:   Rachael Acosta is a 49 y.o. female presenting for chief complaint of Dysuria; Urinary Frequency; Generalized Body Aches; Fever; and Emesis  Reports 2 day history of dysuria and urinary frequency, subjective fever, malaise, nausea and general achiness. Has not tried any medications for relief. Has tried to stay well hydrated. Admits history of recurrent UTIs, was seen by urology ~1 year ago but states that they did not help her. Denies hematuria, urinary urgency, flank pain, abdominal pain, pelvic pain, cloudy malordorous urine, genital rash, genital irritation and vaginal discharge.   Also reports intermittent and transient left ear pain. Denies ear drainage, history of ear infections, headache, sinus pain, rhinorrhea, sore throat, cough. She admits history of seasonal allergies, takes Zyrtec intermittently for this. Denies any other aggravating or relieving factors, no other questions or concerns.  Rachael Acosta has a current medication list which includes the following prescription(s): b complex vitamins, cetirizine, cholecalciferol, multivitamin, and zolpidem. She is allergic to keflex.  Rachael Acosta  has a past medical history of Allergy. Also  has past surgical history that includes Breast enhancement surgery; Breast surgery; and Eye surgery.  ROS As in subjective.  Objective:   Vitals: BP 110/84 mmHg  Pulse 77  Temp(Src) 99 F (37.2 C) (Oral)  Resp 16  Ht 5' 4.5" (1.638 m)  Wt 147 lb (66.679 kg)  BMI 24.85 kg/m2  SpO2 98%  Physical Exam  Constitutional: She is oriented to person, place, and time. She appears well-developed and well-nourished.  HENT:  TM's intact bilaterally, no effusions or erythema. Nares patent, nasal turbinates pink and moist. No sinus tenderness. Oropharynx clear, mucous membranes moist, dentition in good repair.  Eyes: Conjunctivae and EOM are normal. Pupils are equal, round, and reactive to light. Right eye exhibits no  discharge. Left eye exhibits no discharge. No scleral icterus.  Neck: Normal range of motion. Neck supple.  Cardiovascular: Normal rate, regular rhythm and intact distal pulses.  Exam reveals no gallop and no friction rub.   No murmur heard. Pulmonary/Chest: No stridor. No respiratory distress. She has no wheezes. She has no rales.  Abdominal: Soft. Bowel sounds are normal. She exhibits no distension and no mass. There is no tenderness.  Musculoskeletal: She exhibits no edema.  Lymphadenopathy:    She has no cervical adenopathy.  Neurological: She is alert and oriented to person, place, and time.  Skin: Skin is warm and dry. No rash noted. No erythema. No pallor.   Results for orders placed or performed in visit on 02/03/15 (from the past 24 hour(s))  POCT UA - Microscopic Only     Status: None   Collection Time: 02/03/15  6:28 PM  Result Value Ref Range   WBC, Ur, HPF, POC 10-12    RBC, urine, microscopic 0    Bacteria, U Microscopic neg    Mucus, UA neg    Epithelial cells, urine per micros 8-10    Crystals, Ur, HPF, POC neg    Casts, Ur, LPF, POC neg    Yeast, UA neg   POCT urinalysis dipstick     Status: Abnormal   Collection Time: 02/03/15  6:28 PM  Result Value Ref Range   Color, UA yellow    Clarity, UA clear    Glucose, UA neg    Bilirubin, UA neg    Ketones, UA neg    Spec Grav, UA 1.015    Blood, UA neg    pH, UA 7.0  Protein, UA neg    Urobilinogen, UA 0.2    Nitrite, UA neg    Leukocytes, UA small (1+) (A) Negative   Assessment and Plan :   1. Frequent urination 2. Dysuria 3. Cystitis - Urine culture pending, start Septra x10 days, will refer to urology for further work-up, consider interstitial cystitis  4. Seasonal allergies 5. ETD (eustachian tube dysfunction), left - Counseled on diagnosis of ETD, be consistent with allergy tx, reassured patient that her ear does not look infected, counseled on signs and sx of infection, she is to rtc if these  develop.  Wallis Bamberg, PA-C [facility] [phone] 02/03/2015 6:17 PM

## 2015-02-05 LAB — URINE CULTURE: Colony Count: 50000

## 2015-02-08 ENCOUNTER — Encounter: Payer: BLUE CROSS/BLUE SHIELD | Admitting: Internal Medicine

## 2015-04-13 ENCOUNTER — Ambulatory Visit (INDEPENDENT_AMBULATORY_CARE_PROVIDER_SITE_OTHER): Payer: BLUE CROSS/BLUE SHIELD | Admitting: Urgent Care

## 2015-04-13 VITALS — BP 98/74 | HR 73 | Temp 98.3°F | Resp 16 | Ht 65.0 in | Wt 147.6 lb

## 2015-04-13 DIAGNOSIS — J029 Acute pharyngitis, unspecified: Secondary | ICD-10-CM

## 2015-04-13 MED ORDER — HYDROCODONE-HOMATROPINE 5-1.5 MG/5ML PO SYRP: 5.0000 mL | ORAL_SOLUTION | Freq: Every evening | ORAL | Status: DC | PRN

## 2015-04-13 MED ORDER — AMOXICILLIN 875 MG PO TABS: 875.0000 mg | ORAL_TABLET | Freq: Two times a day (BID) | ORAL | Status: DC

## 2015-04-13 NOTE — Progress Notes (Signed)
    MRN: [id number] DOB: Jan 23, 1966  Subjective:   Rachael Acosta is a 49 y.o. female presenting for chief complaint of Sore Throat  Reports 10 day history of worsening sore throat, severe difficulty swallowing, has not been able to eat well due to this. She also has subjective fever, lymph node pain along her neck bilaterally, ear pressure, slight productive cough. She saw different provider at a different practice for same complaint 2-3 weeks ago. Has tried Flonase, Zyrtec daily with out any relief. Denies fever, itchy watery red eyes, ear pain, ear drainage, tooth pain, chest pain, shortness of breath, chest tightness, nausea, vomiting, abdominal pain. Denies any other aggravating or relieving factors, no other questions or concerns.  Rachael Acosta has a current medication list which includes the following prescription(s): b complex vitamins, cetirizine, cholecalciferol, multivitamin, zolpidem, and sulfamethoxazole-trimethoprim. Also is allergic to keflex.  Rachael Acosta  has a past medical history of Allergy. Also  has past surgical history that includes Breast enhancement surgery; Breast surgery; and Eye surgery.  Objective:   Vitals: BP 98/74 mmHg  Pulse 73  Temp(Src) 98.3 F (36.8 C) (Oral)  Resp 16  Ht  (1.651 m)  Wt 147 lb 9.6 oz (66.951 kg)  BMI 24.56 kg/m2  SpO2 98%  LMP 03/30/2015  Physical Exam  Constitutional: She is oriented to person, place, and time. She appears well-developed and well-nourished.  HENT:  TM's intact bilaterally, no effusions or erythema. Nasal turbinates pink and moist. No sinus tenderness. Postnasal drip present with oropharyngeal erythema but no exudates or abscesses.  Neck: Normal range of motion. Neck supple.  Cardiovascular: Normal rate, regular rhythm and intact distal pulses.  Exam reveals no gallop and no friction rub.   No murmur heard. Pulmonary/Chest: No respiratory distress. She has no wheezes. She has no rales.  Musculoskeletal: She exhibits no  edema.  Lymphadenopathy:    She has cervical adenopathy (bilateral, R>L).  Neurological: She is alert and oriented to person, place, and time.  Skin: Skin is warm and dry. No rash noted. No erythema. No pallor.    Assessment and Plan :   1. Acute pharyngitis, unspecified pharyngitis type 2. Sore throat - Will cover empirically for infectious process with Amoxicillin, patient cannot recall if she had a true allergy to Keflex and is willing to try amoxicillin today. Hycodan for symptomatic relief. Counseled patient on potential for adverse affects including rash, patient verbalized understanding and will let me know if this occurs. RTC in 1 weeks if no improvement.  Wallis Bamberg, PA-C [facility] [phone] 04/13/2015 7:08 PM

## 2015-04-13 NOTE — Patient Instructions (Signed)
Pharyngitis Pharyngitis is redness, pain, and swelling (inflammation) of your pharynx.  CAUSES  Pharyngitis is usually caused by infection. Most of the time, these infections are from viruses (viral) and are part of a cold. However, sometimes pharyngitis is caused by bacteria (bacterial). Pharyngitis can also be caused by allergies. Viral pharyngitis may be spread from person to person by coughing, sneezing, and personal items or utensils (cups, forks, spoons, toothbrushes). Bacterial pharyngitis may be spread from person to person by more intimate contact, such as kissing.  SIGNS AND SYMPTOMS  Symptoms of pharyngitis include:   Sore throat.   Tiredness (fatigue).   Low-grade fever.   Headache.  Joint pain and muscle aches.  Skin rashes.  Swollen lymph nodes.  Plaque-like film on throat or tonsils (often seen with bacterial pharyngitis). DIAGNOSIS  Your health care provider will ask you questions about your illness and your symptoms. Your medical history, along with a physical exam, is often all that is needed to diagnose pharyngitis. Sometimes, a rapid strep test is done. Other lab tests may also be done, depending on the suspected cause.  TREATMENT  Viral pharyngitis will usually get better in 3-4 days without the use of medicine. Bacterial pharyngitis is treated with medicines that kill germs (antibiotics).  HOME CARE INSTRUCTIONS   Drink enough water and fluids to keep your urine clear or pale yellow.   Only take over-the-counter or prescription medicines as directed by your health care provider:   If you are prescribed antibiotics, make sure you finish them even if you start to feel better.   Do not take aspirin.   Get lots of rest.   Gargle with 8 oz of salt water ( tsp of salt per 1 qt of water) as often as every 1-2 hours to soothe your throat.   Throat lozenges (if you are not at risk for choking) or sprays may be used to soothe your throat. SEEK MEDICAL  CARE IF:   You have large, tender lumps in your neck.  You have a rash.  You cough up green, yellow-brown, or bloody spit. SEEK IMMEDIATE MEDICAL CARE IF:   Your neck becomes stiff.  You drool or are unable to swallow liquids.  You vomit or are unable to keep medicines or liquids down.  You have severe pain that does not go away with the use of recommended medicines.  You have trouble breathing (not caused by a stuffy nose). MAKE SURE YOU:   Understand these instructions.  Will watch your condition.  Will get help right away if you are not doing well or get worse. Document Released: 07/01/2005 Document Revised: 04/21/2013 Document Reviewed: 03/08/2013 [facility] Patient Information 2015 [place], [place]. This information is not intended to replace advice given to you by your health care provider. Make sure you discuss any questions you have with your health care provider.   Amoxicillin capsules or tablets What is this medicine? AMOXICILLIN (a mox i SIL in) is a penicillin antibiotic. It is used to treat certain kinds of bacterial infections. It will not work for colds, flu, or other viral infections. This medicine may be used for other purposes; ask your health care provider or pharmacist if you have questions. COMMON BRAND NAME(S): Amoxil, Moxilin, Sumox, Trimox What should I tell my health care provider before I take this medicine? They need to know if you have any of these conditions: -asthma -kidney disease -an unusual or allergic reaction to amoxicillin, other penicillins, cephalosporin antibiotics, other medicines, foods, dyes, or  preservatives -pregnant or trying to get pregnant -breast-feeding How should I use this medicine? Take this medicine by mouth with a glass of water. Follow the directions on your prescription label. You may take this medicine with food or on an empty stomach. Take your medicine at regular intervals. Do not take your medicine more often than  directed. Take all of your medicine as directed even if you think your are better. Do not skip doses or stop your medicine early. Talk to your pediatrician regarding the use of this medicine in children. While this drug may be prescribed for selected conditions, precautions do apply. Overdosage: If you think you have taken too much of this medicine contact a poison control center or emergency room at once. NOTE: This medicine is only for you. Do not share this medicine with others. What if I miss a dose? If you miss a dose, take it as soon as you can. If it is almost time for your next dose, take only that dose. Do not take double or extra doses. What may interact with this medicine? -amiloride -birth control pills -chloramphenicol -macrolides -probenecid -sulfonamides -tetracyclines This list may not describe all possible interactions. Give your health care provider a list of all the medicines, herbs, non-prescription drugs, or dietary supplements you use. Also tell them if you smoke, drink alcohol, or use illegal drugs. Some items may interact with your medicine. What should I watch for while using this medicine? Tell your doctor or health care professional if your symptoms do not improve in 2 or 3 days. Take all of the doses of your medicine as directed. Do not skip doses or stop your medicine early. If you are diabetic, you may get a false positive result for sugar in your urine with certain brands of urine tests. Check with your doctor. Do not treat diarrhea with over-the-counter products. Contact your doctor if you have diarrhea that lasts more than 2 days or if the diarrhea is severe and watery. What side effects may I notice from receiving this medicine? Side effects that you should report to your doctor or health care professional as soon as possible: -allergic reactions like skin rash, itching or hives, swelling of the face, lips, or tongue -breathing problems -dark urine -redness,  blistering, peeling or loosening of the skin, including inside the mouth -seizures -severe or watery diarrhea -trouble passing urine or change in the amount of urine -unusual bleeding or bruising -unusually weak or tired -yellowing of the eyes or skin Side effects that usually do not require medical attention (report to your doctor or health care professional if they continue or are bothersome): -dizziness -headache -stomach upset -trouble sleeping This list may not describe all possible side effects. Call your doctor for medical advice about side effects. You may report side effects to FDA at 1-800-FDA-1088. Where should I keep my medicine? Keep out of the reach of children. Store between 68 and 77 degrees F (20 and 25 degrees C). Keep bottle closed tightly. Throw away any unused medicine after the expiration date. NOTE: This sheet is a summary. It may not cover all possible information. If you have questions about this medicine, talk to your doctor, pharmacist, or health care provider.  2015, Elsevier/Gold Standard. (2007-09-22 14:10:59)   Homatropine; Hydrocodone oral syrup What is this medicine? HYDROCODONE (hye droe KOE done) is used to help relieve cough. This medicine may be used for other purposes; ask your health care provider or pharmacist if you have questions. COMMON BRAND  NAME(S): Hycodan, Hydromet, Hydropane, Mycodone What should I tell my health care provider before I take this medicine? They need to know if you have any of these conditions: -brain tumor -drug abuse or addiction -head injury -heart disease -if you frequently drink alcohol-containing drinks -kidney disease -liver disease -lung disease, asthma, or breathing problems -mental problems -an allergic reaction to hydrocodone, other opioid analgesics, other medicines, foods, dyes, or preservatives -pregnant or trying to get pregnant -breast-feeding How should I use this medicine? Take this medicine by  mouth with a full glass of water. Use a specially marked spoon or dropper to measure your dose. Ask your pharmacist if you do not have one. Do not use a household spoon. Follow the directions on the prescription label. Take your medicine at regular intervals. Do not take your medicine more often than directed. Talk to your pediatrician regarding the use of this medicine in children. This medicine is not approved for use in children less than 48 years old. Patients over 37 years old may have a stronger reaction and need a smaller dose. Overdosage: If you think you have taken too much of this medicine contact a poison control center or emergency room at once. NOTE: This medicine is only for you. Do not share this medicine with others. What if I miss a dose? If you miss a dose, take it as soon as you can. If it is almost time for your next dose, take only that dose. Do not take double or extra doses. What may interact with this medicine? -alcohol -antihistamines -MAOIs like Carbex, Eldepryl, Marplan, Nardil, and Parnate -medicines for depression, anxiety, or psychotic disturbances -medicines for sleep -muscle relaxants -naltrexone -narcotic medicines (opiates) for pain -tramadol -tricyclic antidepressants like amitriptyline, clomipramine, desipramine, doxepin, imipramine, nortriptyline, and protriptyline This list may not describe all possible interactions. Give your health care provider a list of all the medicines, herbs, non-prescription drugs, or dietary supplements you use. Also tell them if you smoke, drink alcohol, or use illegal drugs. Some items may interact with your medicine. What should I watch for while using this medicine? You may develop tolerance to this medicine if you take it for a long time. Tolerance means that you will get less cough relief with time. Tell your doctor or health care professional if you symptoms do not improve or if they get worse. Do not suddenly stop taking your  medicine because you may develop a severe reaction. Your body becomes used to the medicine. This does NOT mean you are addicted. Addiction is a behavior related to getting and using a drug for a non-medical reason. If your doctor wants you to stop the medicine, the dose will be slowly lowered over time to avoid any side effects. You may get drowsy or dizzy. Do not drive, use machinery, or do anything that needs mental alertness until you know how this medicine affects you. Do not stand or sit up quickly, especially if you are an older patient. This reduces the risk of dizzy or fainting spells. Alcohol may interfere with the effect of this medicine. Avoid alcoholic drinks. This medicine will cause constipation. Try to have a bowel movement at least every 2 to 3 days. If you do not have a bowel movement for 3 days, call your doctor or health care professional. What side effects may I notice from receiving this medicine? Side effects that you should report to your doctor or health care professional as soon as possible: -allergic reactions like skin rash, itching  or hives, swelling of the face, lips, or tongue -breathing difficulties, wheezing -confusion -light headedness or fainting spells Side effects that usually do not require medical attention (report to your doctor or health care professional if they continue or are bothersome): -dizziness -drowsiness -itching -nausea -vomiting This list may not describe all possible side effects. Call your doctor for medical advice about side effects. You may report side effects to FDA at 1-800-FDA-1088. Where should I keep my medicine? Keep out of the reach of children. This medicine can be abused. Keep your medicine in a safe place to protect it from theft. Do not share this medicine with anyone. Selling or giving away this medicine is dangerous and against the law. Store at room temperature between 15 and 30 degrees C (59 and 86 degrees F). Protect from  light. Throw away any unused medicine after the expiration date. Discard unused medicine and used packaging carefully. Pets and children can be harmed if they find used or lost packages. NOTE: This sheet is a summary. It may not cover all possible information. If you have questions about this medicine, talk to your doctor, pharmacist, or health care provider.  2015, Elsevier/Gold Standard. (2011-03-11 10:04:07)

## 2015-04-17 ENCOUNTER — Encounter: Payer: Self-pay | Admitting: Family Medicine

## 2015-04-17 ENCOUNTER — Ambulatory Visit (INDEPENDENT_AMBULATORY_CARE_PROVIDER_SITE_OTHER): Payer: BLUE CROSS/BLUE SHIELD | Admitting: Family Medicine

## 2015-04-17 VITALS — BP 112/70 | HR 72 | Temp 98.0°F | Resp 16 | Wt 144.0 lb

## 2015-04-17 DIAGNOSIS — R300 Dysuria: Secondary | ICD-10-CM

## 2015-04-17 DIAGNOSIS — Z113 Encounter for screening for infections with a predominantly sexual mode of transmission: Secondary | ICD-10-CM

## 2015-04-17 DIAGNOSIS — N3090 Cystitis, unspecified without hematuria: Secondary | ICD-10-CM

## 2015-04-17 LAB — HIV ANTIBODY (ROUTINE TESTING W REFLEX): HIV 1&2 Ab, 4th Generation: NONREACTIVE

## 2015-04-17 LAB — POCT URINALYSIS DIP (MANUAL ENTRY)
Bilirubin, UA: NEGATIVE
Glucose, UA: NEGATIVE
Ketones, POC UA: NEGATIVE
Nitrite, UA: NEGATIVE
PH UA: 7
PROTEIN UA: NEGATIVE
Spec Grav, UA: <=1.005
UROBILINOGEN UA: 0.2

## 2015-04-17 LAB — POC MICROSCOPIC URINALYSIS (UMFC): MUCUS RE: ABSENT

## 2015-04-17 MED ORDER — NITROFURANTOIN MONOHYD MACRO 100 MG PO CAPS: 100.0000 mg | ORAL_CAPSULE | Freq: Two times a day (BID) | ORAL | Status: DC

## 2015-04-17 NOTE — Patient Instructions (Signed)

## 2015-04-17 NOTE — Progress Notes (Signed)
Subjective:    Patient ID: Kalman Jewels, female    DOB: 12-21-1965, 49 y.o.   MRN: [id number]  HPI This is a pleasant 49 yo female who presents today with 2 days of dysuria. She reports frequent UTIs. She has been seen at [facility] and reports that they gave her bladder training exercises which did not help. She has tried cranberry tablets and frequent voiding. She tends to get UTIs following intercourse. She is very frustrated and feels like the frequent UTIs interfere with her quality of life. She is concerned about travel and using "dirty bathrooms," and limits sexual activity, insisting that her boyfriend withdrawal prior to ejaculation because she thinks his sperm makes her have UTIs.   She has been with her boyfriend for 2 years. She does not have additional sexual partners and doesn't think he does, but she would like to have STD testing today "to make sure."  She was seen 5 days ago at the walk in clinic and treated for UTI. She reports that she is feeling better and continues to take Amoxicillin 875 BID.   Past Medical History  Diagnosis Date  . Allergy    Past Surgical History  Procedure Laterality Date  . Breast enhancement surgery    . Breast surgery    . Eye surgery     Family History  Problem Relation Age of Onset  . Hypertension Mother   . Diabetes Mother   . Diabetes Father   . Hypertension Father   . Eczema Daughter   . Eczema Daughter    Social History  Substance Use Topics  . Smoking status: Never Smoker   . Smokeless tobacco: None  . Alcohol Use: 0.5 oz/week    1 drink(s) per week     Comment: occassionally  Medications, allergies, past medical history, surgical history, family history, social history and problem list reviewed and updated.  Review of Systems No fever or chills, no abdominal pain, occasional mild nausea with amoxicillin, no back pain    Objective:   Physical Exam Physical Exam  Constitutional: Oriented to person, place, and time.  She appears well-developed and well-nourished.  HENT:  Head: Normocephalic and atraumatic.  Eyes: Conjunctivae are normal.  Neck: Normal range of motion. Neck supple.  Cardiovascular: Normal rate, regular rhythm and normal heart sounds.   Pulmonary/Chest: Effort normal and breath sounds normal.  Abdominal: nontender, no CVA tenderness. Musculoskeletal: Normal range of motion.  Neurological: Alert and oriented to person, place, and time.  Skin: Skin is warm and dry.  Psychiatric: Normal mood and affect. Behavior is normal. Judgment and thought content normal.  Vitals reviewed. BP 112/70 mmHg  Pulse 72  Temp(Src) 98 F (36.7 C) (Oral)  Resp 16  Wt 144 lb (65.318 kg)  LMP 03/30/2015 Results for orders placed or performed in visit on 04/17/15  POCT urinalysis dipstick  Result Value Ref Range   Color, UA light yellow (A) yellow   Clarity, UA cloudy (A) clear   Glucose, UA negative negative   Bilirubin, UA negative negative   Ketones, POC UA negative negative   Spec Grav, UA <=1.005    Blood, UA large (A) negative   pH, UA 7.0    Protein Ur, POC negative negative   Urobilinogen, UA 0.2    Nitrite, UA Negative Negative   Leukocytes, UA large (3+) (A) Negative  POCT Microscopic Urinalysis ([facility])  Result Value Ref Range   WBC,UR,HPF,POC Moderate (A) None WBC/hpf   RBC,UR,HPF,POC Few (A) None  RBC/hpf   Bacteria Few (A) None   Mucus Absent Absent   Epithelial Cells, UR Per Microscopy None None cells/hpf      Assessment & Plan:  1. Dysuria - POCT urinalysis dipstick - POCT Microscopic Urinalysis ([facility])  2. Cystitis - nitrofurantoin, macrocrystal-monohydrate, (MACROBID) 100 MG capsule; Take 1 capsule (100 mg total) by mouth 2 (two) times daily.  Dispense: 20 capsule; Refill: 0 - Urine culture - this is a chronic problem for patient and seems to occur post-coital, will await culture and consider post-coital antibiotic.  3. Screening for STD (sexually transmitted disease) -  RPR - HIV antibody - GC/Chlamydia Probe Amp  - RTC precautions reviewed  Olean Ree, FNP-BC  [facility]  04/17/2015 12:13 PM

## 2015-04-18 LAB — RPR

## 2015-04-19 LAB — GC/CHLAMYDIA PROBE AMP
CT PROBE, AMP APTIMA: NEGATIVE
GC PROBE AMP APTIMA: NEGATIVE

## 2015-04-20 LAB — URINE CULTURE: Colony Count: 40000

## 2015-04-22 ENCOUNTER — Other Ambulatory Visit: Payer: Self-pay | Admitting: Family Medicine

## 2015-04-22 DIAGNOSIS — N390 Urinary tract infection, site not specified: Secondary | ICD-10-CM

## 2015-04-22 MED ORDER — NITROFURANTOIN MACROCRYSTAL 50 MG PO CAPS: 50.0000 mg | ORAL_CAPSULE | Freq: Every day | ORAL | Status: AC | PRN

## 2015-05-30 ENCOUNTER — Ambulatory Visit (INDEPENDENT_AMBULATORY_CARE_PROVIDER_SITE_OTHER): Payer: BLUE CROSS/BLUE SHIELD | Admitting: Family Medicine

## 2015-05-30 VITALS — BP 112/72 | HR 69 | Temp 98.2°F | Resp 18 | Ht 64.0 in | Wt 151.0 lb

## 2015-05-30 DIAGNOSIS — N760 Acute vaginitis: Secondary | ICD-10-CM | POA: Diagnosis not present

## 2015-05-30 DIAGNOSIS — N898 Other specified noninflammatory disorders of vagina: Secondary | ICD-10-CM

## 2015-05-30 LAB — POCT WET + KOH PREP
Trich by wet prep: ABSENT
Yeast by KOH: ABSENT
Yeast by wet prep: ABSENT

## 2015-05-30 MED ORDER — METRONIDAZOLE 500 MG PO TABS: 500.0000 mg | ORAL_TABLET | Freq: Two times a day (BID) | ORAL | Status: DC

## 2015-05-30 NOTE — Progress Notes (Signed)
Chief Complaint:  Chief Complaint  Patient presents with  . Vaginitis    fishy since last week after cycle    HPI: Rachael Acosta is a 49 y.o. female who [facility] complaining of 1 week history vaginal dc, fishy odor, has a hx of UTI.  Denies UTI sxs, no dysuria or hematuria, nausea, vomiting, abd pain or back pain She  Finished her LMP last week. She has had sex with husband and has had some odor.  She denies fevers or chills. She did douche and it did not get better.Last GC was negative 04/2015  She had a normal pap in 2014.   Past Medical History  Diagnosis Date  . Allergy    Past Surgical History  Procedure Laterality Date  . Breast enhancement surgery    . Breast surgery    . Eye surgery     Social History   Social History  . Marital Status: Married    Spouse Name: N/A  . Number of Children: N/A  . Years of Education: N/A   Social History Main Topics  . Smoking status: Never Smoker   . Smokeless tobacco: None  . Alcohol Use: 0.5 oz/week    1 drink(s) per week     Comment: occassionally  . Drug Use: No  . Sexual Activity: Yes   Other Topics Concern  . None   Social History Narrative   2 pregnancines   2 live births   Family History  Problem Relation Age of Onset  . Hypertension Mother   . Diabetes Mother   . Diabetes Father   . Hypertension Father   . Eczema Daughter   . Eczema Daughter    Allergies  Allergen Reactions  . Keflex [Cephalexin] Itching and Palpitations   Prior to Admission medications   Medication Sig Start Date End Date Taking? Authorizing Provider  b complex vitamins tablet Take 1 tablet by mouth daily.   Yes Historical Provider, MD  cetirizine (ZYRTEC) 10 MG tablet TAKE ONE TABLET BY MOUTH ONCE DAILY 12/21/14  Yes Chelle Jeffery, PA-C  Cholecalciferol (VITAMIN D3 PO) Take by mouth.   Yes Historical Provider, MD  Multiple Vitamin (MULTIVITAMIN) tablet Take 1 tablet by mouth daily.   Yes Historical Provider, MD    zolpidem (AMBIEN) 10 MG tablet TAKE ONE-HALF TABLET BY MOUTH AT BEDTIME AS NEEDED 12/30/14  Yes Tonye Pearsonobert P Doolittle, MD  amoxicillin (AMOXIL) 875 MG tablet Take 1 tablet (875 mg total) by mouth 2 (two) times daily. Patient not taking: Reported on 05/30/2015 04/13/15   Wallis BambergMario Mani, PA-C  HYDROcodone-homatropine [facility](HYCODAN) 5-1.5 MG/5ML syrup Take 5 mLs by mouth at bedtime as needed. Patient not taking: Reported on 05/30/2015 04/13/15   Wallis BambergMario Mani, PA-C  nitrofurantoin (MACRODANTIN) 50 MG capsule Take 1 capsule (50 mg total) by mouth daily as needed (following intercourse to prevent bladder infection). Patient not taking: Reported on 05/30/2015 04/22/15   Emi Belfasteborah B Gessner, FNP  nitrofurantoin, macrocrystal-monohydrate, (MACROBID) 100 MG capsule Take 1 capsule (100 mg total) by mouth 2 (two) times daily. Patient not taking: Reported on 05/30/2015 04/17/15   Emi Belfasteborah B Gessner, FNP     ROS: The patient denies fevers, chills, night sweats, unintentional weight loss, chest pain, palpitations, wheezing, dyspnea on exertion, nausea, vomiting, abdominal pain, dysuria, hematuria, melena, numbness, weakness, or tingling.  All other systems have been reviewed and were otherwise negative with the exception of those mentioned in the HPI and as above.  PHYSICAL EXAM: Filed Vitals:   05/30/15 1807  BP: 112/72  Pulse: 69  Temp: 98.2 F (36.8 C)  Resp: 18   Body mass index is 25.91 kg/(m^2).   General: Alert, no acute distress HEENT:  Normocephalic, atraumatic, oropharynx patent. EOMI, PERRLA Cardiovascular:  Regular rate and rhythm, no rubs murmurs or gallops.   Respiratory: Clear to auscultation bilaterally.  No wheezes, rales, or rhonchi.  No cyanosis, no use of accessory musculature Abdominal: No organomegaly, abdomen is soft and non-tender, positive bowel sounds. No masses. Skin: No rashes. Neurologic: Facial musculature symmetric. Psychiatric: Patient acts appropriately throughout our  interaction. Lymphatic: No cervical or submandibular lymphadenopathy Musculoskeletal: Gait intact. No edema, tenderness GU-+ copious dc, no CMT, malodorous, no rashes/masses   LABS: Results for orders placed or performed in visit on 05/30/15  POCT Wet + KOH Prep  Result Value Ref Range   Yeast by KOH Absent Present, Absent   Yeast by wet prep Absent Present, Absent   WBC by wet prep None None, Few, Too numerous to count   Clue Cells Wet Prep HPF POC None None, Too numerous to count   Trich by wet prep Absent Present, Absent   Bacteria Wet Prep HPF POC Moderate (A) None, Few, Too numerous to count   Epithelial Cells By [employer] [facility] Moderate (A) None, Few, Too numerous to count   RBC,UR,HPF,POC None None RBC/hpf     EKG/XRAY:   Primary read interpreted by Dr[facility] Conley Rolls at [facility].   ASSESSMENT/PLAN: Encounter Diagnoses  Name Primary?  . Vaginal discharge Yes  . Vaginitis and vulvovaginitis    Rx flagyl if sxs do not improve, she states this feels exactly like her BV  I have advised her to wait and see if this worsens and is so then can take the Flagyl, am suspicious this may be BV since she has copious amounts of dc on swab, smells  fishy  Fu prn    Gross sideeffects, risk and benefits, and alternatives of medications d/w patient. Patient is aware that all medications have potential sideeffects and we are unable to predict every sideeffect or drug-drug interaction that may occur.  [name] DO  05/30/2015 7:12 PM

## 2015-06-05 ENCOUNTER — Ambulatory Visit (INDEPENDENT_AMBULATORY_CARE_PROVIDER_SITE_OTHER): Payer: BLUE CROSS/BLUE SHIELD | Admitting: Family Medicine

## 2015-06-05 ENCOUNTER — Encounter: Payer: Self-pay | Admitting: Family Medicine

## 2015-06-05 VITALS — BP 114/76 | HR 77 | Temp 97.7°F | Resp 16 | Wt 147.0 lb

## 2015-06-05 DIAGNOSIS — L989 Disorder of the skin and subcutaneous tissue, unspecified: Secondary | ICD-10-CM | POA: Diagnosis not present

## 2015-06-05 DIAGNOSIS — K219 Gastro-esophageal reflux disease without esophagitis: Secondary | ICD-10-CM | POA: Diagnosis not present

## 2015-06-05 DIAGNOSIS — B370 Candidal stomatitis: Secondary | ICD-10-CM

## 2015-06-05 MED ORDER — NYSTATIN 100000 UNIT/ML MT SUSP: 5.0000 mL | Freq: Four times a day (QID) | OROMUCOSAL | Status: DC

## 2015-06-05 MED ORDER — OMEPRAZOLE 20 MG PO CPDR: 20.0000 mg | DELAYED_RELEASE_CAPSULE | Freq: Every day | ORAL | Status: AC

## 2015-06-05 NOTE — Progress Notes (Signed)
   Subjective:    Patient ID: Rachael Acosta, female    DOB: 02/13/1966, 49 y.o.   MRN: [id number]  HPI This is a pleasant 49 yo female who presents today complaining of difficulty swallowing foods. This is worse with spicy foods and has gotten progressively worse over the last couple of months. She has "heart burn," stomach pain and chest tightness if she eats a large portion or eats greasy foods. Does not feel like food is stuck. Has not had any vomiting. She took some tums and immodium (no diarrhea, she just had some around) without relief. Always with irregular bowels, usually every 2-3 days. Does not take anything regularly. Can't remember when she last had BM. Does not recall any preceding gastroenteritis. Did have nitrofurantoin last month for UTI and course of amoxicillin 2 months ago for URI.   She was seen last week for BV. She has taken most of oral Flagyl. Has had some mild stomach upset.   She has a skin lesion on her left breast. It has been there for several years, but she thinks it is getting larger.    Review of Systems  Constitutional: Positive for unexpected weight change. Negative for fever.  Respiratory: Positive for chest tightness (with abdominal sympytoms, none with exertion).   Gastrointestinal: Positive for abdominal pain and constipation. Negative for nausea, vomiting, diarrhea, blood in stool and abdominal distention.      Objective:   Physical Exam Physical Exam  Constitutional: Oriented to person, place, and time. She appears well-developed and well-nourished.  HENT:  Head: Normocephalic and atraumatic.  Eyes: Conjunctivae are normal.  Mouth: white coating on tongue Neck: Normal range of motion. Neck supple.  Cardiovascular: Normal rate, regular rhythm and normal heart sounds.   Pulmonary/Chest: Effort normal and breath sounds normal.  Abdomen: normal bowel sounds, mildly tender to palpation left lower quadrant, no organomegaly, no distension. Musculoskeletal:  Normal range of motion.  Neurological: Alert and oriented to person, place, and time.  Skin: Skin is warm and dry. Approximately 1 cm, hyperpigmented, raised lesion on RUQ of left breast. Psychiatric: Normal mood and affect. Behavior is normal. Judgment and thought content normal.  Vitals reviewed.  BP 114/76 mmHg  Pulse 77  Temp(Src) 97.7 F (36.5 C)  Resp 16  Wt 147 lb (66.679 kg)  LMP 05/21/2015 Wt Readings from Last 3 Encounters:  06/05/15 147 lb (66.679 kg)  05/30/15 151 lb (68.493 kg)  04/17/15 144 lb (65.318 kg)      Assessment & Plan:  1. Gastroesophageal reflux disease, esophagitis presence not specified - Provided written and verbal information regarding diagnosis and treatment. - omeprazole (PRILOSEC) 20 MG capsule; Take 1 capsule (20 mg total) by mouth daily.  Dispense: 30 capsule; Refill: 1 - If not better in 2-3 weeks or if worse, RTC, if better, can get refill and RTC in 2 months  2. Skin lesion - given location and recent change, will refer to derm - Ambulatory referral to Dermatology  3. Oral thrush - nystatin (MYCOSTATIN) 100000 UNIT/ML suspension; Take 5 mLs (500,000 Units total) by mouth 4 (four) times daily.  Dispense: 60 mL; Refill: 0 - suspect this is related to recent antibiotic use, HIV 10/16 negative - Not sure if related to her swallowing symptoms  Olean Reeeborah Fara Worthy, FNP-BC  [facility], [facility]  06/05/2015 12:26 PM

## 2015-06-05 NOTE — Patient Instructions (Signed)
For constipation, you can use Miralax every day  Gastroesophageal Reflux Disease, Adult Normally, food travels down the esophagus and stays in the stomach to be digested. However, when a person has gastroesophageal reflux disease (GERD), food and stomach acid move back up into the esophagus. When this happens, the esophagus becomes sore and inflamed. Over time, GERD can create small holes (ulcers) in the lining of the esophagus.  CAUSES This condition is caused by a problem with the muscle between the esophagus and the stomach (lower esophageal sphincter, or LES). Normally, the LES muscle closes after food passes through the esophagus to the stomach. When the LES is weakened or abnormal, it does not close properly, and that allows food and stomach acid to go back up into the esophagus. The LES can be weakened by certain dietary substances, medicines, and medical conditions, including:  Tobacco use.  Pregnancy.  Having a hiatal hernia.  Heavy alcohol use.  Certain foods and beverages, such as coffee, chocolate, onions, and peppermint. RISK FACTORS This condition is more likely to develop in:  People who have an increased body weight.  People who have connective tissue disorders.  People who use NSAID medicines. SYMPTOMS Symptoms of this condition include:  Heartburn.  Difficult or painful swallowing.  The feeling of having a lump in the throat.  Abitter taste in the mouth.  Bad breath.  Having a large amount of saliva.  Having an upset or bloated stomach.  Belching.  Chest pain.  Shortness of breath or wheezing.  Ongoing (chronic) cough or a night-time cough.  Wearing away of tooth enamel.  Weight loss. Different conditions can cause chest pain. Make sure to see your health care provider if you experience chest pain. DIAGNOSIS Your health care provider will take a medical history and perform a physical exam. To determine if you have mild or severe GERD, your  health care provider may also monitor how you respond to treatment. You may also have other tests, including:  An endoscopy toexamine your stomach and esophagus with a small camera.  A test thatmeasures the acidity level in your esophagus.  A test thatmeasures how much pressure is on your esophagus.  A barium swallow or modified barium swallow to show the shape, size, and functioning of your esophagus. TREATMENT The goal of treatment is to help relieve your symptoms and to prevent complications. Treatment for this condition may vary depending on how severe your symptoms are. Your health care provider may recommend:  Changes to your diet.  Medicine.  Surgery. HOME CARE INSTRUCTIONS Diet  Follow a diet as recommended by your health care provider. This may involve avoiding foods and drinks such as:  Coffee and tea (with or without caffeine).  Drinks that containalcohol.  Energy drinks and sports drinks.  Carbonated drinks or sodas.  Chocolate and cocoa.  Peppermint and mint flavorings.  Garlic and onions.  Horseradish.  Spicy and acidic foods, including peppers, chili powder, curry powder, vinegar, hot sauces, and barbecue sauce.  Citrus fruit juices and citrus fruits, such as oranges, lemons, and limes.  Tomato-based foods, such as red sauce, chili, salsa, and pizza with red sauce.  Fried and fatty foods, such as donuts, french fries, potato chips, and high-fat dressings.  High-fat meats, such as hot dogs and fatty cuts of red and white meats, such as rib eye steak, sausage, ham, and bacon.  High-fat dairy items, such as whole milk, butter, and cream cheese.  Eat small, frequent meals instead of large meals.  Avoid drinking large amounts of liquid with your meals.  Avoid eating meals during the 2-3 hours before bedtime.  Avoid lying down right after you eat.  Do not exercise right after you eat. General Instructions  Pay attention to any changes in  your symptoms.  Take over-the-counter and prescription medicines only as told by your health care provider. Do not take aspirin, ibuprofen, or other NSAIDs unless your health care provider told you to do so.  Do not use any tobacco products, including cigarettes, chewing tobacco, and e-cigarettes. If you need help quitting, ask your health care provider.  Wear loose-fitting clothing. Do not wear anything tight around your waist that causes pressure on your abdomen.  Raise (elevate) the head of your bed 6 inches (15cm).  Try to reduce your stress, such as with yoga or meditation. If you need help reducing stress, ask your health care provider.  If you are overweight, reduce your weight to an amount that is healthy for you. Ask your health care provider for guidance about a safe weight loss goal.  Keep all follow-up visits as told by your health care provider. This is important. SEEK MEDICAL CARE IF:  You have new symptoms.  You have unexplained weight loss.  You have difficulty swallowing, or it hurts to swallow.  You have wheezing or a persistent cough.  Your symptoms do not improve with treatment.  You have a hoarse voice. SEEK IMMEDIATE MEDICAL CARE IF:  You have pain in your arms, neck, jaw, teeth, or back.  You feel sweaty, dizzy, or light-headed.  You have chest pain or shortness of breath.  You vomit and your vomit looks like blood or coffee grounds.  You faint.  Your stool is bloody or black.  You cannot swallow, drink, or eat.   This information is not intended to replace advice given to you by your health care provider. Make sure you discuss any questions you have with your health care provider.   Document Released: 04/10/2005 Document Revised: 03/22/2015 Document Reviewed: 10/26/2014 Elsevier Interactive Patient Education [employer]

## 2015-06-06 ENCOUNTER — Ambulatory Visit: Payer: BLUE CROSS/BLUE SHIELD | Admitting: Family Medicine

## 2015-06-09 ENCOUNTER — Other Ambulatory Visit: Payer: Self-pay

## 2015-06-09 MED ORDER — FLUTICASONE PROPIONATE 50 MCG/ACT NA SUSP: NASAL | Status: AC

## 2015-07-17 ENCOUNTER — Other Ambulatory Visit: Payer: Self-pay

## 2015-07-17 MED ORDER — CETIRIZINE HCL 10 MG PO TABS: ORAL_TABLET | ORAL | Status: DC

## 2015-10-27 DIAGNOSIS — L989 Disorder of the skin and subcutaneous tissue, unspecified: Secondary | ICD-10-CM | POA: Diagnosis not present

## 2015-10-27 DIAGNOSIS — D3610 Benign neoplasm of peripheral nerves and autonomic nervous system, unspecified: Secondary | ICD-10-CM | POA: Diagnosis not present

## 2015-11-06 DIAGNOSIS — M545 Low back pain: Secondary | ICD-10-CM | POA: Diagnosis not present

## 2015-11-13 DIAGNOSIS — Z6825 Body mass index (BMI) 25.0-25.9, adult: Secondary | ICD-10-CM | POA: Diagnosis not present

## 2015-11-13 DIAGNOSIS — R070 Pain in throat: Secondary | ICD-10-CM | POA: Diagnosis not present

## 2015-11-13 DIAGNOSIS — R05 Cough: Secondary | ICD-10-CM | POA: Diagnosis not present

## 2015-11-13 DIAGNOSIS — R5383 Other fatigue: Secondary | ICD-10-CM | POA: Diagnosis not present

## 2015-11-20 DIAGNOSIS — M545 Low back pain: Secondary | ICD-10-CM | POA: Diagnosis not present

## 2015-11-24 ENCOUNTER — Ambulatory Visit (INDEPENDENT_AMBULATORY_CARE_PROVIDER_SITE_OTHER): Payer: BLUE CROSS/BLUE SHIELD | Admitting: Physician Assistant

## 2015-11-24 VITALS — BP 109/67 | HR 74 | Temp 98.3°F | Resp 16 | Ht 64.0 in | Wt 150.6 lb

## 2015-11-24 DIAGNOSIS — N3080 Other cystitis without hematuria: Secondary | ICD-10-CM | POA: Diagnosis not present

## 2015-11-24 DIAGNOSIS — R300 Dysuria: Secondary | ICD-10-CM | POA: Diagnosis not present

## 2015-11-24 DIAGNOSIS — N3090 Cystitis, unspecified without hematuria: Secondary | ICD-10-CM

## 2015-11-24 LAB — POCT URINALYSIS DIP (MANUAL ENTRY)
Bilirubin, UA: NEGATIVE
GLUCOSE UA: NEGATIVE
Ketones, POC UA: NEGATIVE
NITRITE UA: NEGATIVE
Protein Ur, POC: NEGATIVE
Spec Grav, UA: 1.015
UROBILINOGEN UA: 0.2
pH, UA: 7

## 2015-11-24 LAB — POC MICROSCOPIC URINALYSIS (UMFC): Mucus: ABSENT

## 2015-11-24 NOTE — Progress Notes (Signed)
[facility] [address] [place] [address] [id number]  Date:  11/24/2015   Name:  Rachael Acosta   DOB:  [birth year]   MRN:  [id number]  PCP:  No PCP Per Patient    Chief Complaint: Urinary Frequency   History of Present Illness:  This is a 50 y.o. female with PMH recurrent UTI who is presenting with dysuria and urinary freq x 6 days. Pt was increasing her fluid intake to see if she could get rid of it on her own but not working. Not getting worse, just staying the same. She is here with same symptoms 3-4 times a year. Looking back at several urine cultures since 2013, only 2 have been true UTIs. She is usually treated with an antibiotic. She states her symptoms typically do not improve unless she gets an antibiotic. She has tried many things that have not helped - she has tried cranberry pills, taking a prophylactic abx after sex, increasing her hydration. She is petrified of sex because it always makes her symptoms much worse. She has been referred to a urologist in the past about 2 years ago -- states she really didn't like who she saw at [facility] and does not want to pay money to see a specialist if it isn't going to help.  Vaginal discharge: no Hematuria: no Abdominal pain: no Fever/chills: no Nausea/vominting: no Back pain: no LMP: 11/10/15 Sexually active: not currently Aggravating/Alleviating factors: advil and helped.  She drinks 2 cups of coffee a day. Socially drinks alcohol. No artificial sweeteners. Eats a lot of spicy food. Feels worse if she does not drink a lot of water. Works a job where she is mostly standing.  Review of Systems:  Review of Systems See HPI  Patient Active Problem List   Diagnosis Date Noted  . Cystitis 02/03/2015  . Recurrent UTI 08/30/2014  . Back pain 02/03/2013  . AR (allergic rhinitis) 09/17/2011    Prior to Admission medications   Medication Sig Start Date End Date Taking? Authorizing Provider  b complex  vitamins tablet Take 1 tablet by mouth daily.   Yes Historical Provider, MD  cetirizine (ZYRTEC) 10 MG tablet TAKE ONE TABLET BY MOUTH ONCE DAILY 07/17/15  Yes Wallis Bamberg, PA-C  fluticasone [facility]) 50 MCG/ACT nasal spray USE ONE SPRAY (S) IN EACH NOSTRIL TWICE DAILY 06/09/15  Yes Wallis Bamberg, PA-C  Multiple Vitamin (MULTIVITAMIN) tablet Take 1 tablet by mouth daily.   Yes Historical Provider, MD  nitrofurantoin (MACRODANTIN) 50 MG capsule Take 1 capsule (50 mg total) by mouth daily as needed (following intercourse to prevent bladder infection). 04/22/15  Yes Emi Belfast, FNP  zolpidem (AMBIEN) 10 MG tablet TAKE ONE-HALF TABLET BY MOUTH AT BEDTIME AS NEEDED 12/30/14  Yes Tonye Pearson, MD  Cholecalciferol (VITAMIN D3 PO) Take by mouth. Reported on 11/24/2015    Historical Provider, MD  omeprazole (PRILOSEC) 20 MG capsule Take 1 capsule (20 mg total) by mouth daily. Patient not taking: Reported on 11/24/2015 06/05/15   Emi Belfast, FNP    Allergies  Allergen Reactions  . Keflex [Cephalexin] Itching and Palpitations    Past Surgical History  Procedure Laterality Date  . Breast enhancement surgery    . Breast surgery    . Eye surgery      Social History  Substance Use Topics  . Smoking status: Never Smoker   . Smokeless tobacco: None  . Alcohol Use: 0.5 oz/week    1  drink(s) per week     Comment: occassionally    Family History  Problem Relation Age of Onset  . Hypertension Mother   . Diabetes Mother   . Diabetes Father   . Hypertension Father   . Eczema Daughter   . Eczema Daughter     Medication list has been reviewed and updated.  Physical Examination:  Physical Exam  Constitutional: She is oriented to person, place, and time. She appears well-developed and well-nourished. No distress.  HENT:  Head: Normocephalic and atraumatic.  Right Ear: Hearing normal.  Left Ear: Hearing normal.  Nose: Nose normal.  Eyes: Conjunctivae and lids are normal. Right eye  exhibits no discharge. Left eye exhibits no discharge. No scleral icterus.  Pulmonary/Chest: Effort normal. No respiratory distress.  Abdominal: Soft. Normal appearance. There is tenderness in the suprapubic area. There is no CVA tenderness.  Musculoskeletal: Normal range of motion.  Neurological: She is alert and oriented to person, place, and time.  Skin: Skin is warm, dry and intact. No lesion and no rash noted.  Psychiatric: She has a normal mood and affect. Her speech is normal and behavior is normal. Thought content normal.   BP 109/67 mmHg  Pulse 74  Temp(Src) 98.3 F (36.8 C) (Oral)  Resp 16  Ht 5\' 4"  (1.626 m)  Wt 150 lb 9.6 oz (68.312 kg)  BMI 25.84 kg/m2  SpO2 97%  Results for orders placed or performed in visit on 11/24/15  POCT Microscopic Urinalysis ([facility])  Result Value Ref Range   WBC,UR,HPF,POC Moderate (A) None WBC/hpf   RBC,UR,HPF,POC Few (A) None RBC/hpf   Bacteria Few (A) None, Too numerous to count   Mucus Absent Absent   Epithelial Cells, UR Per Microscopy Few (A) None, Too numerous to count cells/hpf  POCT urinalysis dipstick  Result Value Ref Range   Color, UA yellow yellow   Clarity, UA hazy (A) clear   Glucose, UA negative negative   Bilirubin, UA negative negative   Ketones, POC UA negative negative   Spec Grav, UA 1.015    Blood, UA trace-lysed (A) negative   pH, UA 7.0    Protein Ur, POC negative negative   Urobilinogen, UA 0.2    Nitrite, UA Negative Negative   Leukocytes, UA small (1+) (A) Negative    Assessment and Plan:  1. Dysuria 2. Recurrent cystitis Recurrent cystitis. Most of the urine cultures without significant growth. Possible interstitial cystitis but pt is reluctant to go back to urologist -- did not have a good experience in the past. UA here inconclusive. Will send for culture. Counseled on lifestyle changes for interstitial cystitis for her to try to see if helpful -- eliminate caffeine, alcohol, artificial sweeteners, spicy  food during flares. No prolonged sitting. Stay hydrated. Return in 1 week if symptoms not improving. - POCT Microscopic Urinalysis ([facility]) - POCT urinalysis dipstick - Urine culture   Roswell MinersNicole V. Dyke BrackettBush, PA-C, MHS [facility]  11/24/2015

## 2015-11-24 NOTE — Patient Instructions (Addendum)
Try to eliminate caffeine, alcohol, artificial sweeteners and spicy foods when you are having problems with burning and urinary frequency. I will call you with your lab results. I will send in an antibiotic if needed. Focus on hydration   IF you received an x-ray today, you will receive an invoice from [facility]. Please contact [facility] at [phone] with questions or concerns regarding your invoice.   IF you received labwork today, you will receive an invoice from [facility]. Please contact [facility] at [phone] with questions or concerns regarding your invoice.   Our billing staff will not be able to assist you with questions regarding bills from these companies.  You will be contacted with the lab results as soon as they are available. The fastest way to get your results is to activate your My Chart account. Instructions are located on the last page of this paperwork. If you have not heard from us regarding the results in 2 weeks, please contact this office.

## 2015-11-24 NOTE — Telephone Encounter (Signed)
close

## 2015-11-25 ENCOUNTER — Telehealth: Payer: Self-pay

## 2015-11-25 DIAGNOSIS — R300 Dysuria: Secondary | ICD-10-CM

## 2015-11-25 NOTE — Telephone Encounter (Signed)
Pt is req. UTI rx...  [phone] please call

## 2015-11-26 ENCOUNTER — Telehealth: Payer: Self-pay | Admitting: Emergency Medicine

## 2015-11-26 LAB — URINE CULTURE

## 2015-11-26 MED ORDER — CIPROFLOXACIN HCL 250 MG PO TABS: 250.0000 mg | ORAL_TABLET | Freq: Two times a day (BID) | ORAL | Status: DC

## 2015-11-26 NOTE — Telephone Encounter (Signed)
I call patient. She is having severe dysuria and lower abdominal discomfort. I will go ahead and send in Cipro 250 twice a day #10. Her culture was pending.

## 2015-11-27 NOTE — Telephone Encounter (Signed)
Patient is aware to finish abx and that her Culture was negative for bacteria. I advise patient that we would like for her to see a urologist for interstitial cystitis. Patient agreed and informed her to wait on call for appointment details.

## 2015-11-27 NOTE — Telephone Encounter (Signed)
Please call pt. Dr. Cleta Albertsaub sent pt in some abx, which she should take until finished. However, her urine culture AGAIN did not grow out any bacteria. She very likely has interstitial cystitis like we discussed. She needs to see a urologist for further testing and treatment. I have referred her to urologist, Dr. Retta Dionesahlstedt who came recommended by Dr. Cleta Albertsaub. I strongly encourage her to follow through with this appt.

## 2015-12-04 DIAGNOSIS — Z1389 Encounter for screening for other disorder: Secondary | ICD-10-CM | POA: Diagnosis not present

## 2015-12-04 DIAGNOSIS — D251 Intramural leiomyoma of uterus: Secondary | ICD-10-CM | POA: Diagnosis not present

## 2015-12-04 DIAGNOSIS — Z6824 Body mass index (BMI) 24.0-24.9, adult: Secondary | ICD-10-CM | POA: Diagnosis not present

## 2015-12-04 DIAGNOSIS — Z87440 Personal history of urinary (tract) infections: Secondary | ICD-10-CM | POA: Diagnosis not present

## 2015-12-04 DIAGNOSIS — Z01419 Encounter for gynecological examination (general) (routine) without abnormal findings: Secondary | ICD-10-CM | POA: Diagnosis not present

## 2015-12-04 DIAGNOSIS — Z130 Encounter for screening for diseases of the blood and blood-forming organs and certain disorders involving the immune mechanism: Secondary | ICD-10-CM | POA: Diagnosis not present

## 2015-12-13 DIAGNOSIS — M545 Low back pain: Secondary | ICD-10-CM | POA: Diagnosis not present

## 2015-12-18 DIAGNOSIS — J309 Allergic rhinitis, unspecified: Secondary | ICD-10-CM | POA: Diagnosis not present

## 2015-12-18 DIAGNOSIS — D259 Leiomyoma of uterus, unspecified: Secondary | ICD-10-CM | POA: Diagnosis not present

## 2015-12-18 DIAGNOSIS — D6489 Other specified anemias: Secondary | ICD-10-CM | POA: Diagnosis not present

## 2015-12-18 DIAGNOSIS — Z1389 Encounter for screening for other disorder: Secondary | ICD-10-CM | POA: Diagnosis not present

## 2015-12-25 DIAGNOSIS — M545 Low back pain: Secondary | ICD-10-CM | POA: Diagnosis not present

## 2016-01-03 DIAGNOSIS — M5417 Radiculopathy, lumbosacral region: Secondary | ICD-10-CM | POA: Diagnosis not present

## 2016-01-03 DIAGNOSIS — M5126 Other intervertebral disc displacement, lumbar region: Secondary | ICD-10-CM | POA: Diagnosis not present

## 2016-01-08 DIAGNOSIS — M545 Low back pain: Secondary | ICD-10-CM | POA: Diagnosis not present

## 2016-02-12 DIAGNOSIS — M545 Low back pain: Secondary | ICD-10-CM | POA: Diagnosis not present

## 2016-02-19 DIAGNOSIS — M545 Low back pain: Secondary | ICD-10-CM | POA: Diagnosis not present

## 2016-02-20 DIAGNOSIS — M545 Low back pain: Secondary | ICD-10-CM | POA: Diagnosis not present

## 2016-02-28 DIAGNOSIS — M545 Low back pain: Secondary | ICD-10-CM | POA: Diagnosis not present

## 2016-03-05 ENCOUNTER — Other Ambulatory Visit: Payer: Self-pay

## 2016-03-05 MED ORDER — CETIRIZINE HCL 10 MG PO TABS: ORAL_TABLET | ORAL | 0 refills | Status: AC

## 2016-03-11 DIAGNOSIS — M545 Low back pain: Secondary | ICD-10-CM | POA: Diagnosis not present

## 2016-03-25 DIAGNOSIS — M545 Low back pain: Secondary | ICD-10-CM | POA: Diagnosis not present

## 2016-04-08 DIAGNOSIS — M545 Low back pain: Secondary | ICD-10-CM | POA: Diagnosis not present

## 2016-04-17 DIAGNOSIS — M545 Low back pain: Secondary | ICD-10-CM | POA: Diagnosis not present

## 2016-04-17 DIAGNOSIS — M5126 Other intervertebral disc displacement, lumbar region: Secondary | ICD-10-CM | POA: Diagnosis not present

## 2016-04-17 DIAGNOSIS — Z6825 Body mass index (BMI) 25.0-25.9, adult: Secondary | ICD-10-CM | POA: Diagnosis not present

## 2016-04-17 DIAGNOSIS — M5417 Radiculopathy, lumbosacral region: Secondary | ICD-10-CM | POA: Diagnosis not present

## 2016-05-06 ENCOUNTER — Other Ambulatory Visit: Payer: Self-pay | Admitting: Urgent Care

## 2016-05-13 DIAGNOSIS — R300 Dysuria: Secondary | ICD-10-CM | POA: Diagnosis not present

## 2016-05-13 DIAGNOSIS — R358 Other polyuria: Secondary | ICD-10-CM | POA: Diagnosis not present

## 2016-05-13 DIAGNOSIS — R8299 Other abnormal findings in urine: Secondary | ICD-10-CM | POA: Diagnosis not present

## 2016-05-22 DIAGNOSIS — Z1231 Encounter for screening mammogram for malignant neoplasm of breast: Secondary | ICD-10-CM | POA: Diagnosis not present

## 2016-06-13 ENCOUNTER — Encounter: Payer: Self-pay | Admitting: Podiatry

## 2016-06-13 ENCOUNTER — Ambulatory Visit (INDEPENDENT_AMBULATORY_CARE_PROVIDER_SITE_OTHER): Payer: BLUE CROSS/BLUE SHIELD | Admitting: Podiatry

## 2016-06-13 DIAGNOSIS — L600 Ingrowing nail: Secondary | ICD-10-CM

## 2016-06-13 MED ORDER — NEOMYCIN-POLYMYXIN-HC 1 % OT SOLN: OTIC | 1 refills | Status: AC

## 2016-06-13 NOTE — Patient Instructions (Signed)

## 2016-06-14 NOTE — Progress Notes (Signed)
She presents today with a chief complaint of ingrown toenail to the hallux left.  Objective: Vital signs are stable alert pain at 3 pulses are palpable. Neurologic sensorium is intact. Degenerative flexor intact. Muscle strength is normal bilateral. Orthopedic evaluation demonstrates rectus foot type bilateral corrected bunion deformities in the past. Cutaneous evaluation and straight sharply incurvated nail margins tibial and fibular borders of the hallux left.  Assessment: Ingrown nail paronychia abscess hallux left.  Plan: Chemical matrixectomy performed today after local anesthesia was administered. She tolerated procedure well. Was prior provided with both oral and instruction for care and soaking of her toe she will also be provided with Cortisporin Otic prescription which she will apply twice daily after soaking.

## 2016-06-18 ENCOUNTER — Telehealth: Payer: Self-pay | Admitting: *Deleted

## 2016-06-18 NOTE — Telephone Encounter (Signed)
Entered in error

## 2016-06-18 NOTE — Telephone Encounter (Signed)
Pt states she had a procedure but it is not healing well. Unable to contact pt the [phone] [facility] and the other number was invalid.

## 2016-06-20 ENCOUNTER — Encounter: Payer: Self-pay | Admitting: Podiatry

## 2016-06-20 ENCOUNTER — Ambulatory Visit (INDEPENDENT_AMBULATORY_CARE_PROVIDER_SITE_OTHER): Payer: BLUE CROSS/BLUE SHIELD | Admitting: Podiatry

## 2016-06-20 DIAGNOSIS — L03032 Cellulitis of left toe: Secondary | ICD-10-CM

## 2016-06-20 DIAGNOSIS — L600 Ingrowing nail: Secondary | ICD-10-CM

## 2016-06-20 MED ORDER — CIPROFLOXACIN HCL 500 MG PO TABS: 500.0000 mg | ORAL_TABLET | Freq: Two times a day (BID) | ORAL | 0 refills | Status: DC

## 2016-06-20 NOTE — Patient Instructions (Signed)

## 2016-06-23 NOTE — Progress Notes (Signed)
She presents today for follow-up of her nail procedures hallux left she states that my toe really hurts and seems to be getting infected. She states that she has not been soaking regularly and has only been soaking in warm water.  Objective: Vital signs are stable she's alert and oriented 3. The toe itself does appear to be mildly erythematous but there is no purulence and no malodor is mildly tender on palpation.  Assessment: Mild paronychia status post matrixectomy hallux left secondary to noncompliance.  Plan: Start her on Cipro 500 mg twice daily encouraged her to soak in Epsom salts and warm water and will follow up with her in a a week  to make sure she has healed.

## 2016-06-27 ENCOUNTER — Ambulatory Visit: Payer: BLUE CROSS/BLUE SHIELD | Admitting: Podiatry

## 2016-06-27 DIAGNOSIS — K30 Functional dyspepsia: Secondary | ICD-10-CM | POA: Diagnosis not present

## 2016-07-01 DIAGNOSIS — M5417 Radiculopathy, lumbosacral region: Secondary | ICD-10-CM | POA: Diagnosis not present

## 2016-07-01 DIAGNOSIS — M5126 Other intervertebral disc displacement, lumbar region: Secondary | ICD-10-CM | POA: Diagnosis not present

## 2016-07-02 ENCOUNTER — Encounter: Payer: Self-pay | Admitting: Podiatry

## 2016-07-02 ENCOUNTER — Ambulatory Visit (INDEPENDENT_AMBULATORY_CARE_PROVIDER_SITE_OTHER): Payer: BLUE CROSS/BLUE SHIELD | Admitting: Podiatry

## 2016-07-02 DIAGNOSIS — L600 Ingrowing nail: Secondary | ICD-10-CM | POA: Diagnosis not present

## 2016-07-02 MED ORDER — DOXYCYCLINE HYCLATE 100 MG PO TABS: 100.0000 mg | ORAL_TABLET | Freq: Two times a day (BID) | ORAL | 1 refills | Status: AC

## 2016-07-03 NOTE — Progress Notes (Signed)
She presents today for follow-up of a ingrown toenail. She states that she's been soaking it and she refers the hallux left.  Objective: Vital signs are stable she is alert and oriented 3. The toe is red and draining from the distal aspect. It is tender to palpation and swollen. Pulses remain palpable.  Assessment: Pain limb secondary to ingrown nail or retained paronychia status post matricectomy hallux right.  Plan: I currently debrided the wound today and reapplied phenol removed all necrotic tissue at this point looks very nice and clean. She will continue soaking and also prescribed doxycycline. Follow up with her next week

## 2016-07-16 DIAGNOSIS — M5417 Radiculopathy, lumbosacral region: Secondary | ICD-10-CM | POA: Diagnosis not present

## 2016-07-16 DIAGNOSIS — M5126 Other intervertebral disc displacement, lumbar region: Secondary | ICD-10-CM | POA: Diagnosis not present

## 2016-07-18 ENCOUNTER — Ambulatory Visit: Payer: BLUE CROSS/BLUE SHIELD | Admitting: Podiatry

## 2016-07-24 DIAGNOSIS — M545 Low back pain: Secondary | ICD-10-CM | POA: Diagnosis not present

## 2016-07-25 ENCOUNTER — Encounter: Payer: Self-pay | Admitting: Podiatry

## 2016-07-25 ENCOUNTER — Ambulatory Visit (INDEPENDENT_AMBULATORY_CARE_PROVIDER_SITE_OTHER): Payer: Self-pay | Admitting: Podiatry

## 2016-07-25 DIAGNOSIS — L600 Ingrowing nail: Secondary | ICD-10-CM

## 2016-07-25 NOTE — Progress Notes (Signed)
She presents today for nail check matrixectomy of the left hallux. She states is doing great now no problems.  Objective: Vital signs are stable alert and oriented 3. Pulses are palpable. Neurologic sensorium is intact. Deep tendon reflexes are intact. No signs of infection. No erythema cellulitis drainage or odor.  Assessment: Well healing surgical toe.  Plan: Follow up with me on an as-needed basis.

## 2016-08-05 DIAGNOSIS — M545 Low back pain: Secondary | ICD-10-CM | POA: Diagnosis not present

## 2016-08-16 DIAGNOSIS — R1032 Left lower quadrant pain: Secondary | ICD-10-CM | POA: Diagnosis not present

## 2016-08-16 DIAGNOSIS — D6489 Other specified anemias: Secondary | ICD-10-CM | POA: Diagnosis not present

## 2016-08-16 DIAGNOSIS — D259 Leiomyoma of uterus, unspecified: Secondary | ICD-10-CM | POA: Diagnosis not present

## 2016-08-16 DIAGNOSIS — M545 Low back pain: Secondary | ICD-10-CM | POA: Diagnosis not present

## 2016-08-19 DIAGNOSIS — Z6826 Body mass index (BMI) 26.0-26.9, adult: Secondary | ICD-10-CM | POA: Diagnosis not present

## 2016-08-19 DIAGNOSIS — M545 Low back pain: Secondary | ICD-10-CM | POA: Diagnosis not present

## 2016-08-19 DIAGNOSIS — R1032 Left lower quadrant pain: Secondary | ICD-10-CM | POA: Diagnosis not present

## 2016-08-19 DIAGNOSIS — J069 Acute upper respiratory infection, unspecified: Secondary | ICD-10-CM | POA: Diagnosis not present

## 2016-08-20 ENCOUNTER — Other Ambulatory Visit: Payer: Self-pay | Admitting: Internal Medicine

## 2016-08-20 DIAGNOSIS — R1032 Left lower quadrant pain: Secondary | ICD-10-CM

## 2016-08-26 ENCOUNTER — Other Ambulatory Visit: Payer: Self-pay

## 2016-08-27 ENCOUNTER — Ambulatory Visit
Admission: RE | Admit: 2016-08-27 | Discharge: 2016-08-27 | Disposition: A | Discharge status: Home / Self Care | Payer: Self-pay | Source: Ambulatory Visit | Attending: Internal Medicine | Admitting: Internal Medicine

## 2016-08-27 ENCOUNTER — Ambulatory Visit
Admission: RE | Admit: 2016-08-27 | Discharge: 2016-08-27 | Disposition: A | Discharge status: Home / Self Care | Payer: BLUE CROSS/BLUE SHIELD | Source: Ambulatory Visit | Attending: Internal Medicine | Admitting: Internal Medicine

## 2016-08-27 DIAGNOSIS — R1032 Left lower quadrant pain: Secondary | ICD-10-CM

## 2016-08-27 DIAGNOSIS — D259 Leiomyoma of uterus, unspecified: Secondary | ICD-10-CM | POA: Diagnosis not present

## 2016-09-04 DIAGNOSIS — M545 Low back pain: Secondary | ICD-10-CM | POA: Diagnosis not present

## 2016-09-17 DIAGNOSIS — D251 Intramural leiomyoma of uterus: Secondary | ICD-10-CM | POA: Diagnosis not present

## 2016-09-17 DIAGNOSIS — D259 Leiomyoma of uterus, unspecified: Secondary | ICD-10-CM | POA: Diagnosis not present

## 2016-09-18 DIAGNOSIS — M545 Low back pain: Secondary | ICD-10-CM | POA: Diagnosis not present

## 2016-10-14 DIAGNOSIS — M545 Low back pain: Secondary | ICD-10-CM | POA: Diagnosis not present

## 2016-10-15 DIAGNOSIS — M5417 Radiculopathy, lumbosacral region: Secondary | ICD-10-CM | POA: Diagnosis not present

## 2016-10-15 DIAGNOSIS — M5126 Other intervertebral disc displacement, lumbar region: Secondary | ICD-10-CM | POA: Diagnosis not present

## 2016-12-02 DIAGNOSIS — M5126 Other intervertebral disc displacement, lumbar region: Secondary | ICD-10-CM | POA: Diagnosis not present

## 2016-12-02 DIAGNOSIS — Z6825 Body mass index (BMI) 25.0-25.9, adult: Secondary | ICD-10-CM | POA: Diagnosis not present

## 2016-12-11 DIAGNOSIS — N921 Excessive and frequent menstruation with irregular cycle: Secondary | ICD-10-CM | POA: Diagnosis not present

## 2016-12-11 DIAGNOSIS — Z130 Encounter for screening for diseases of the blood and blood-forming organs and certain disorders involving the immune mechanism: Secondary | ICD-10-CM | POA: Diagnosis not present

## 2016-12-11 DIAGNOSIS — Z6824 Body mass index (BMI) 24.0-24.9, adult: Secondary | ICD-10-CM | POA: Diagnosis not present

## 2016-12-11 DIAGNOSIS — N6321 Unspecified lump in the left breast, upper outer quadrant: Secondary | ICD-10-CM | POA: Diagnosis not present

## 2016-12-11 DIAGNOSIS — Z1389 Encounter for screening for other disorder: Secondary | ICD-10-CM | POA: Diagnosis not present

## 2016-12-11 DIAGNOSIS — Z01419 Encounter for gynecological examination (general) (routine) without abnormal findings: Secondary | ICD-10-CM | POA: Diagnosis not present

## 2017-01-02 DIAGNOSIS — R922 Inconclusive mammogram: Secondary | ICD-10-CM | POA: Diagnosis not present

## 2017-01-02 DIAGNOSIS — N6002 Solitary cyst of left breast: Secondary | ICD-10-CM | POA: Diagnosis not present

## 2017-01-02 DIAGNOSIS — R921 Mammographic calcification found on diagnostic imaging of breast: Secondary | ICD-10-CM | POA: Diagnosis not present

## 2017-03-21 DIAGNOSIS — N6311 Unspecified lump in the right breast, upper outer quadrant: Secondary | ICD-10-CM | POA: Diagnosis not present

## 2017-03-27 DIAGNOSIS — N6011 Diffuse cystic mastopathy of right breast: Secondary | ICD-10-CM | POA: Diagnosis not present

## 2017-04-21 DIAGNOSIS — N390 Urinary tract infection, site not specified: Secondary | ICD-10-CM | POA: Diagnosis not present

## 2017-04-21 DIAGNOSIS — R300 Dysuria: Secondary | ICD-10-CM | POA: Diagnosis not present

## 2017-05-06 DIAGNOSIS — M5417 Radiculopathy, lumbosacral region: Secondary | ICD-10-CM | POA: Diagnosis not present

## 2017-05-06 DIAGNOSIS — M5126 Other intervertebral disc displacement, lumbar region: Secondary | ICD-10-CM | POA: Diagnosis not present

## 2017-05-06 DIAGNOSIS — Z6825 Body mass index (BMI) 25.0-25.9, adult: Secondary | ICD-10-CM | POA: Diagnosis not present

## 2017-05-23 DIAGNOSIS — J111 Influenza due to unidentified influenza virus with other respiratory manifestations: Secondary | ICD-10-CM | POA: Diagnosis not present

## 2017-05-23 DIAGNOSIS — T753XXA Motion sickness, initial encounter: Secondary | ICD-10-CM | POA: Diagnosis not present

## 2017-05-23 DIAGNOSIS — J069 Acute upper respiratory infection, unspecified: Secondary | ICD-10-CM | POA: Diagnosis not present

## 2017-05-23 DIAGNOSIS — J019 Acute sinusitis, unspecified: Secondary | ICD-10-CM | POA: Diagnosis not present

## 2017-09-15 DIAGNOSIS — R300 Dysuria: Secondary | ICD-10-CM | POA: Diagnosis not present

## 2017-09-15 DIAGNOSIS — R8290 Unspecified abnormal findings in urine: Secondary | ICD-10-CM | POA: Diagnosis not present

## 2017-09-25 DIAGNOSIS — K1120 Sialoadenitis, unspecified: Secondary | ICD-10-CM | POA: Diagnosis not present

## 2017-09-25 DIAGNOSIS — J029 Acute pharyngitis, unspecified: Secondary | ICD-10-CM | POA: Diagnosis not present

## 2017-09-25 DIAGNOSIS — R05 Cough: Secondary | ICD-10-CM | POA: Diagnosis not present

## 2017-09-25 DIAGNOSIS — Z6826 Body mass index (BMI) 26.0-26.9, adult: Secondary | ICD-10-CM | POA: Diagnosis not present

## 2017-09-27 ENCOUNTER — Encounter (HOSPITAL_COMMUNITY): Payer: Self-pay | Admitting: Emergency Medicine

## 2017-09-27 ENCOUNTER — Emergency Department (HOSPITAL_COMMUNITY)
Admission: EM | Admit: 2017-09-27 | Discharge: 2017-09-27 | Disposition: A | Discharge status: Home / Self Care | Payer: BLUE CROSS/BLUE SHIELD | Attending: Emergency Medicine | Admitting: Emergency Medicine

## 2017-09-27 DIAGNOSIS — R6883 Chills (without fever): Secondary | ICD-10-CM | POA: Diagnosis not present

## 2017-09-27 DIAGNOSIS — J029 Acute pharyngitis, unspecified: Secondary | ICD-10-CM | POA: Diagnosis not present

## 2017-09-27 LAB — RAPID STREP SCREEN (MED CTR MEBANE ONLY): Streptococcus, Group A Screen (Direct): NEGATIVE

## 2017-09-27 MED ORDER — KETOROLAC TROMETHAMINE 60 MG/2ML IM SOLN
60.0000 mg | Freq: Once | INTRAMUSCULAR | Status: AC
  Administered 2017-09-27: 60 mg via INTRAMUSCULAR
  Filled 2017-09-27: qty 2

## 2017-09-27 MED ORDER — AMOXICILLIN 500 MG PO CAPS: 500.0000 mg | ORAL_CAPSULE | Freq: Two times a day (BID) | ORAL | 0 refills | Status: AC

## 2017-09-27 MED ORDER — NAPROXEN 500 MG PO TABS: 500.0000 mg | ORAL_TABLET | Freq: Two times a day (BID) | ORAL | 0 refills | Status: AC

## 2017-09-27 NOTE — ED Provider Notes (Signed)
[facility]-EMERGENCY DEPT Provider Note   CSN: [id number] Arrival date & time: 09/27/17  4098     History   Chief Complaint Chief Complaint  Patient presents with  . Sore Throat  . Chills    HPI Rachael Acosta is a 52 y.o. female.  HPI  Patient presents with complaint of sore throat for the past 6 days.  She states she has pain with swallowing.  No difficulty breathing.  She has had subjective fevers.  She was seen earlier in the week by her doctor and had a negative strep test.  She also has a mild cough and some nasal congestion.  She states she has tried Aleve and Tylenol without much relief of her symptoms.  No specific sick contacts.  There are no other associated systemic symptoms, there are no other alleviating or modifying factors.   Past Medical History:  Diagnosis Date  . Allergy     Patient Active Problem List   Diagnosis Date Noted  . Recurrent cystitis 11/24/2015  . Back pain 02/03/2013  . AR (allergic rhinitis) 09/17/2011    Past Surgical History:  Procedure Laterality Date  . BREAST ENHANCEMENT SURGERY    . BREAST SURGERY    . EYE SURGERY      OB History    No data available       Home Medications    Prior to Admission medications   Medication Sig Start Date End Date Taking? Authorizing Provider  amoxicillin (AMOXIL) 500 MG capsule Take 1 capsule (500 mg total) by mouth 2 (two) times daily. 09/27/17   Phillis Haggis, MD  b complex vitamins tablet Take 1 tablet by mouth daily.    [provider]  cetirizine (ZYRTEC) 10 MG tablet TAKE ONE TABLET BY MOUTH ONCE DAILY 03/05/16   Wallis Bamberg, PA-C  Cholecalciferol (VITAMIN D3 PO) Take by mouth. Reported on 11/24/2015    [provider]  doxycycline (VIBRA-TABS) 100 MG tablet Take 1 tablet (100 mg total) by mouth 2 (two) times daily. 07/02/16   Hyatt, Max T, DPM  fluticasone (FLONASE) 50 MCG/ACT nasal spray USE ONE SPRAY (S) IN EACH NOSTRIL TWICE DAILY 06/09/15   Wallis Bamberg, PA-C  gabapentin (NEURONTIN) 300 MG capsule  07/01/16   [provider]  Multiple Vitamin (MULTIVITAMIN) tablet Take 1 tablet by mouth daily.    [provider]  naproxen (NAPROSYN) 500 MG tablet Take 1 tablet (500 mg total) by mouth 2 (two) times daily. 09/27/17   Carleton Vanvalkenburgh, Latanya Maudlin, MD  NEOMYCIN-POLYMYXIN-HYDROCORTISONE (CORTISPORIN) 1 % SOLN otic solution Apply 1-2 drops to toe BID after soaking 06/13/16   Hyatt, Max T, DPM  nitrofurantoin (MACRODANTIN) 50 MG capsule Take 1 capsule (50 mg total) by mouth daily as needed (following intercourse to prevent bladder infection). 04/22/15   Emi Belfast, FNP  omeprazole (PRILOSEC) 20 MG capsule Take 1 capsule (20 mg total) by mouth daily. Patient not taking: Reported on 11/24/2015 06/05/15   Emi Belfast, FNP  pantoprazole (PROTONIX) 40 MG tablet  06/27/16   [provider]  zolpidem (AMBIEN) 10 MG tablet TAKE ONE-HALF TABLET BY MOUTH AT BEDTIME AS NEEDED 12/30/14   Tonye Pearson, MD    Family History Family History  Problem Relation Age of Onset  . Hypertension Mother   . Diabetes Mother   . Diabetes Father   . Hypertension Father   . Eczema Daughter   . Eczema Daughter     Social History Social  History   Tobacco Use  . Smoking status: Never Smoker  . Smokeless tobacco: Never Used  Substance Use Topics  . Alcohol use: Yes    Alcohol/week: 0.5 oz    Types: 1 Standard drinks or equivalent per week    Comment: occassionally  . Drug use: No     Allergies   Keflex [cephalexin]   Review of Systems Review of Systems  ROS reviewed and all otherwise negative except for mentioned in HPI   Physical Exam Updated Vital Signs BP 124/65   Pulse 79   Temp 98.7 F (37.1 C) (Oral)   Resp 18   Ht 5\' 5"  (1.651 m)   Wt 70.9 kg (156 lb 4.8 oz)   SpO2 100%   BMI 26.01 kg/m  Vitals reviewed Physical Exam  Physical Examination: General appearance - alert, well appearing, and in no  distress Mental status - alert, oriented to person, place, and time Eyes - no conjunctival injection, no scleral icterus Mouth - mucous membranes moist, pharynx normal without lesions, mild erythema of OP, palate symmetric, uvula midline Neck - supple, tender cervical LAD Chest - clear to auscultation, no wheezes, rales or rhonchi, symmetric air entry Heart - normal rate, regular rhythm, normal S1, S2, no murmurs, rubs, clicks or gallops Neurological - alert, oriented, normal speech, Extremities - peripheral pulses normal, no swelling Skin - normal coloration and turgor, no rashes,   ED Treatments / Results  Labs (all labs ordered are listed, but only abnormal results are displayed) Labs Reviewed  RAPID STREP SCREEN ([facility])  CULTURE, GROUP A STREP [facility])    EKG  EKG Interpretation None       Radiology No results found.  Procedures Procedures (including critical care time)  Medications Ordered in ED Medications  ketorolac (TORADOL) injection 60 mg (60 mg Intramuscular Given 09/27/17 0859)     Initial Impression / Assessment and Plan / ED Course  I have reviewed the triage vital signs and the nursing notes.  Pertinent labs & imaging results that were available during my care of the patient were reviewed by me and considered in my medical decision making (see chart for details).     Patient presented with complaint of sore throat which is been present for the past 6 days.  She is also had some mild cough and congestion.  No fever associated.  She has tried over-the-counter remedies without much relief.  Rapid strep today is negative.  She has no asymmetry of her palate to suggest peritonsillar abscess.  However due to 6 days and pain worsening will treat empirically with amoxicillin.  She has taken amoxicillin in the past without reaction.  Advised salt water gargle and anti-inflammatories for discomfort.  Discharged with strict return precautions.  Pt agreeable with  plan.  Final Clinical Impressions(s) / ED Diagnoses   Final diagnoses:  Acute pharyngitis, unspecified etiology    ED Discharge Orders        Ordered    amoxicillin (AMOXIL) 500 MG capsule  2 times daily     09/27/17 1115    naproxen (NAPROSYN) 500 MG tablet  2 times daily     09/27/17 1117       Embrie Mikkelsen, Latanya MaudlinMartha L, MD 09/27/17 1203

## 2017-09-27 NOTE — ED Notes (Signed)
Pt c/o fever/chills, swollen lymph nodes, and lack of appetite. She was seen at her family doctor on 3/14 and placed on doxycycline.

## 2017-09-27 NOTE — ED Triage Notes (Signed)
Patient here from home with complaints of left sided sore throat and chills x1 week. Reports seeing primary care doctor Thursday and was started on Doxycycline with no relief. Also complains of generalized body aches.

## 2017-09-27 NOTE — ED Notes (Signed)
Bed: [id number] Expected date:  Expected time:  Means of arrival:  Comments: Level 4 only

## 2017-09-27 NOTE — Discharge Instructions (Signed)
Return to the ED with any concerns including difficulty breathing, vomiting and not able to keep down liquids or antibiotics, decreased urine output, decreased level of alertness/lethargy, or any other alarming symptoms  °

## 2017-09-29 DIAGNOSIS — M2669 Other specified disorders of temporomandibular joint: Secondary | ICD-10-CM | POA: Diagnosis not present

## 2017-09-29 DIAGNOSIS — Z6826 Body mass index (BMI) 26.0-26.9, adult: Secondary | ICD-10-CM | POA: Diagnosis not present

## 2017-09-29 DIAGNOSIS — K1379 Other lesions of oral mucosa: Secondary | ICD-10-CM | POA: Diagnosis not present

## 2017-09-29 DIAGNOSIS — K047 Periapical abscess without sinus: Secondary | ICD-10-CM | POA: Diagnosis not present

## 2017-09-29 LAB — CULTURE, GROUP A STREP (THRC)

## 2017-10-09 DIAGNOSIS — Z6825 Body mass index (BMI) 25.0-25.9, adult: Secondary | ICD-10-CM | POA: Diagnosis not present

## 2017-10-09 DIAGNOSIS — K047 Periapical abscess without sinus: Secondary | ICD-10-CM | POA: Diagnosis not present

## 2017-10-09 DIAGNOSIS — H9202 Otalgia, left ear: Secondary | ICD-10-CM | POA: Diagnosis not present

## 2017-11-06 DIAGNOSIS — Z0000 Encounter for general adult medical examination without abnormal findings: Secondary | ICD-10-CM | POA: Diagnosis not present

## 2017-11-06 DIAGNOSIS — R82998 Other abnormal findings in urine: Secondary | ICD-10-CM | POA: Diagnosis not present

## 2017-11-13 DIAGNOSIS — M2669 Other specified disorders of temporomandibular joint: Secondary | ICD-10-CM | POA: Diagnosis not present

## 2017-11-13 DIAGNOSIS — Z0000 Encounter for general adult medical examination without abnormal findings: Secondary | ICD-10-CM | POA: Diagnosis not present

## 2017-11-13 DIAGNOSIS — H9202 Otalgia, left ear: Secondary | ICD-10-CM | POA: Diagnosis not present

## 2017-11-13 DIAGNOSIS — K1120 Sialoadenitis, unspecified: Secondary | ICD-10-CM | POA: Diagnosis not present

## 2017-11-13 DIAGNOSIS — Z1389 Encounter for screening for other disorder: Secondary | ICD-10-CM | POA: Diagnosis not present

## 2017-11-13 DIAGNOSIS — M545 Low back pain: Secondary | ICD-10-CM | POA: Diagnosis not present

## 2017-11-14 DIAGNOSIS — Z1212 Encounter for screening for malignant neoplasm of rectum: Secondary | ICD-10-CM | POA: Diagnosis not present

## 2017-11-24 DIAGNOSIS — N390 Urinary tract infection, site not specified: Secondary | ICD-10-CM | POA: Diagnosis not present

## 2017-11-24 DIAGNOSIS — R300 Dysuria: Secondary | ICD-10-CM | POA: Diagnosis not present

## 2017-12-01 DIAGNOSIS — Z1211 Encounter for screening for malignant neoplasm of colon: Secondary | ICD-10-CM | POA: Diagnosis not present

## 2017-12-01 DIAGNOSIS — K219 Gastro-esophageal reflux disease without esophagitis: Secondary | ICD-10-CM | POA: Diagnosis not present

## 2017-12-10 DIAGNOSIS — K228 Other specified diseases of esophagus: Secondary | ICD-10-CM | POA: Diagnosis not present

## 2017-12-10 DIAGNOSIS — K635 Polyp of colon: Secondary | ICD-10-CM | POA: Diagnosis not present

## 2017-12-10 DIAGNOSIS — Z1211 Encounter for screening for malignant neoplasm of colon: Secondary | ICD-10-CM | POA: Diagnosis not present

## 2017-12-10 DIAGNOSIS — K2930 Chronic superficial gastritis without bleeding: Secondary | ICD-10-CM | POA: Diagnosis not present

## 2017-12-10 DIAGNOSIS — B9681 Helicobacter pylori [H. pylori] as the cause of diseases classified elsewhere: Secondary | ICD-10-CM | POA: Diagnosis not present

## 2017-12-10 DIAGNOSIS — D130 Benign neoplasm of esophagus: Secondary | ICD-10-CM | POA: Diagnosis not present

## 2017-12-10 DIAGNOSIS — K6389 Other specified diseases of intestine: Secondary | ICD-10-CM | POA: Diagnosis not present

## 2017-12-10 DIAGNOSIS — K589 Irritable bowel syndrome without diarrhea: Secondary | ICD-10-CM | POA: Diagnosis not present

## 2017-12-10 DIAGNOSIS — D126 Benign neoplasm of colon, unspecified: Secondary | ICD-10-CM | POA: Diagnosis not present

## 2017-12-16 DIAGNOSIS — K2930 Chronic superficial gastritis without bleeding: Secondary | ICD-10-CM | POA: Diagnosis not present

## 2017-12-16 DIAGNOSIS — K228 Other specified diseases of esophagus: Secondary | ICD-10-CM | POA: Diagnosis not present

## 2017-12-16 DIAGNOSIS — D130 Benign neoplasm of esophagus: Secondary | ICD-10-CM | POA: Diagnosis not present

## 2017-12-16 DIAGNOSIS — D126 Benign neoplasm of colon, unspecified: Secondary | ICD-10-CM | POA: Diagnosis not present

## 2017-12-22 DIAGNOSIS — N390 Urinary tract infection, site not specified: Secondary | ICD-10-CM | POA: Diagnosis not present

## 2017-12-29 DIAGNOSIS — Z86010 Personal history of colonic polyps: Secondary | ICD-10-CM | POA: Diagnosis not present

## 2017-12-29 DIAGNOSIS — A048 Other specified bacterial intestinal infections: Secondary | ICD-10-CM | POA: Diagnosis not present

## 2017-12-29 DIAGNOSIS — K219 Gastro-esophageal reflux disease without esophagitis: Secondary | ICD-10-CM | POA: Diagnosis not present

## 2017-12-29 DIAGNOSIS — N3021 Other chronic cystitis with hematuria: Secondary | ICD-10-CM | POA: Diagnosis not present

## 2018-01-08 DIAGNOSIS — Z1389 Encounter for screening for other disorder: Secondary | ICD-10-CM | POA: Diagnosis not present

## 2018-01-08 DIAGNOSIS — Z130 Encounter for screening for diseases of the blood and blood-forming organs and certain disorders involving the immune mechanism: Secondary | ICD-10-CM | POA: Diagnosis not present

## 2018-01-08 DIAGNOSIS — Z6824 Body mass index (BMI) 24.0-24.9, adult: Secondary | ICD-10-CM | POA: Diagnosis not present

## 2018-01-08 DIAGNOSIS — Z01419 Encounter for gynecological examination (general) (routine) without abnormal findings: Secondary | ICD-10-CM | POA: Diagnosis not present

## 2018-01-28 DIAGNOSIS — N6001 Solitary cyst of right breast: Secondary | ICD-10-CM | POA: Diagnosis not present

## 2018-01-28 DIAGNOSIS — N6012 Diffuse cystic mastopathy of left breast: Secondary | ICD-10-CM | POA: Diagnosis not present

## 2018-01-28 DIAGNOSIS — N6011 Diffuse cystic mastopathy of right breast: Secondary | ICD-10-CM | POA: Diagnosis not present

## 2018-02-04 DIAGNOSIS — L239 Allergic contact dermatitis, unspecified cause: Secondary | ICD-10-CM | POA: Diagnosis not present

## 2018-03-03 DIAGNOSIS — R8271 Bacteriuria: Secondary | ICD-10-CM | POA: Diagnosis not present

## 2018-04-08 DIAGNOSIS — A048 Other specified bacterial intestinal infections: Secondary | ICD-10-CM | POA: Diagnosis not present

## 2018-04-08 DIAGNOSIS — K219 Gastro-esophageal reflux disease without esophagitis: Secondary | ICD-10-CM | POA: Diagnosis not present

## 2018-04-09 DIAGNOSIS — A048 Other specified bacterial intestinal infections: Secondary | ICD-10-CM | POA: Diagnosis not present

## 2018-04-15 DIAGNOSIS — M5417 Radiculopathy, lumbosacral region: Secondary | ICD-10-CM | POA: Diagnosis not present

## 2018-04-15 DIAGNOSIS — M5126 Other intervertebral disc displacement, lumbar region: Secondary | ICD-10-CM | POA: Diagnosis not present

## 2018-04-20 DIAGNOSIS — M9903 Segmental and somatic dysfunction of lumbar region: Secondary | ICD-10-CM | POA: Diagnosis not present

## 2018-04-20 DIAGNOSIS — M9905 Segmental and somatic dysfunction of pelvic region: Secondary | ICD-10-CM | POA: Diagnosis not present

## 2018-04-20 DIAGNOSIS — M9914 Subluxation complex (vertebral) of sacral region: Secondary | ICD-10-CM | POA: Diagnosis not present

## 2018-04-20 DIAGNOSIS — M5441 Lumbago with sciatica, right side: Secondary | ICD-10-CM | POA: Diagnosis not present

## 2018-04-27 DIAGNOSIS — M9903 Segmental and somatic dysfunction of lumbar region: Secondary | ICD-10-CM | POA: Diagnosis not present

## 2018-04-27 DIAGNOSIS — M9905 Segmental and somatic dysfunction of pelvic region: Secondary | ICD-10-CM | POA: Diagnosis not present

## 2018-04-27 DIAGNOSIS — M5441 Lumbago with sciatica, right side: Secondary | ICD-10-CM | POA: Diagnosis not present

## 2018-04-27 DIAGNOSIS — M9914 Subluxation complex (vertebral) of sacral region: Secondary | ICD-10-CM | POA: Diagnosis not present

## 2018-04-28 DIAGNOSIS — M9903 Segmental and somatic dysfunction of lumbar region: Secondary | ICD-10-CM | POA: Diagnosis not present

## 2018-04-28 DIAGNOSIS — M5441 Lumbago with sciatica, right side: Secondary | ICD-10-CM | POA: Diagnosis not present

## 2018-04-28 DIAGNOSIS — M9905 Segmental and somatic dysfunction of pelvic region: Secondary | ICD-10-CM | POA: Diagnosis not present

## 2018-04-28 DIAGNOSIS — M9914 Subluxation complex (vertebral) of sacral region: Secondary | ICD-10-CM | POA: Diagnosis not present

## 2018-04-30 DIAGNOSIS — M9914 Subluxation complex (vertebral) of sacral region: Secondary | ICD-10-CM | POA: Diagnosis not present

## 2018-04-30 DIAGNOSIS — M9905 Segmental and somatic dysfunction of pelvic region: Secondary | ICD-10-CM | POA: Diagnosis not present

## 2018-04-30 DIAGNOSIS — M9903 Segmental and somatic dysfunction of lumbar region: Secondary | ICD-10-CM | POA: Diagnosis not present

## 2018-04-30 DIAGNOSIS — M5441 Lumbago with sciatica, right side: Secondary | ICD-10-CM | POA: Diagnosis not present

## 2018-05-04 DIAGNOSIS — M9903 Segmental and somatic dysfunction of lumbar region: Secondary | ICD-10-CM | POA: Diagnosis not present

## 2018-05-04 DIAGNOSIS — M5441 Lumbago with sciatica, right side: Secondary | ICD-10-CM | POA: Diagnosis not present

## 2018-05-04 DIAGNOSIS — M9905 Segmental and somatic dysfunction of pelvic region: Secondary | ICD-10-CM | POA: Diagnosis not present

## 2018-05-04 DIAGNOSIS — M9914 Subluxation complex (vertebral) of sacral region: Secondary | ICD-10-CM | POA: Diagnosis not present

## 2018-05-05 DIAGNOSIS — M5441 Lumbago with sciatica, right side: Secondary | ICD-10-CM | POA: Diagnosis not present

## 2018-05-05 DIAGNOSIS — M9903 Segmental and somatic dysfunction of lumbar region: Secondary | ICD-10-CM | POA: Diagnosis not present

## 2018-05-05 DIAGNOSIS — M9914 Subluxation complex (vertebral) of sacral region: Secondary | ICD-10-CM | POA: Diagnosis not present

## 2018-05-05 DIAGNOSIS — M9905 Segmental and somatic dysfunction of pelvic region: Secondary | ICD-10-CM | POA: Diagnosis not present

## 2018-05-07 DIAGNOSIS — M5441 Lumbago with sciatica, right side: Secondary | ICD-10-CM | POA: Diagnosis not present

## 2018-05-07 DIAGNOSIS — M9903 Segmental and somatic dysfunction of lumbar region: Secondary | ICD-10-CM | POA: Diagnosis not present

## 2018-05-07 DIAGNOSIS — M9905 Segmental and somatic dysfunction of pelvic region: Secondary | ICD-10-CM | POA: Diagnosis not present

## 2018-05-07 DIAGNOSIS — M9914 Subluxation complex (vertebral) of sacral region: Secondary | ICD-10-CM | POA: Diagnosis not present

## 2018-05-11 DIAGNOSIS — M9905 Segmental and somatic dysfunction of pelvic region: Secondary | ICD-10-CM | POA: Diagnosis not present

## 2018-05-11 DIAGNOSIS — M9914 Subluxation complex (vertebral) of sacral region: Secondary | ICD-10-CM | POA: Diagnosis not present

## 2018-05-11 DIAGNOSIS — M9903 Segmental and somatic dysfunction of lumbar region: Secondary | ICD-10-CM | POA: Diagnosis not present

## 2018-05-11 DIAGNOSIS — M5441 Lumbago with sciatica, right side: Secondary | ICD-10-CM | POA: Diagnosis not present

## 2018-05-13 DIAGNOSIS — M5441 Lumbago with sciatica, right side: Secondary | ICD-10-CM | POA: Diagnosis not present

## 2018-05-13 DIAGNOSIS — M9905 Segmental and somatic dysfunction of pelvic region: Secondary | ICD-10-CM | POA: Diagnosis not present

## 2018-05-13 DIAGNOSIS — M9914 Subluxation complex (vertebral) of sacral region: Secondary | ICD-10-CM | POA: Diagnosis not present

## 2018-05-13 DIAGNOSIS — M9903 Segmental and somatic dysfunction of lumbar region: Secondary | ICD-10-CM | POA: Diagnosis not present

## 2018-05-15 DIAGNOSIS — M9905 Segmental and somatic dysfunction of pelvic region: Secondary | ICD-10-CM | POA: Diagnosis not present

## 2018-05-15 DIAGNOSIS — M9903 Segmental and somatic dysfunction of lumbar region: Secondary | ICD-10-CM | POA: Diagnosis not present

## 2018-05-15 DIAGNOSIS — M5441 Lumbago with sciatica, right side: Secondary | ICD-10-CM | POA: Diagnosis not present

## 2018-05-15 DIAGNOSIS — M9914 Subluxation complex (vertebral) of sacral region: Secondary | ICD-10-CM | POA: Diagnosis not present

## 2018-05-18 DIAGNOSIS — M9905 Segmental and somatic dysfunction of pelvic region: Secondary | ICD-10-CM | POA: Diagnosis not present

## 2018-05-18 DIAGNOSIS — M9914 Subluxation complex (vertebral) of sacral region: Secondary | ICD-10-CM | POA: Diagnosis not present

## 2018-05-18 DIAGNOSIS — M9903 Segmental and somatic dysfunction of lumbar region: Secondary | ICD-10-CM | POA: Diagnosis not present

## 2018-05-18 DIAGNOSIS — M5441 Lumbago with sciatica, right side: Secondary | ICD-10-CM | POA: Diagnosis not present

## 2018-05-20 DIAGNOSIS — M5441 Lumbago with sciatica, right side: Secondary | ICD-10-CM | POA: Diagnosis not present

## 2018-05-20 DIAGNOSIS — M9914 Subluxation complex (vertebral) of sacral region: Secondary | ICD-10-CM | POA: Diagnosis not present

## 2018-05-20 DIAGNOSIS — M9903 Segmental and somatic dysfunction of lumbar region: Secondary | ICD-10-CM | POA: Diagnosis not present

## 2018-05-20 DIAGNOSIS — M9905 Segmental and somatic dysfunction of pelvic region: Secondary | ICD-10-CM | POA: Diagnosis not present

## 2018-05-22 DIAGNOSIS — M9903 Segmental and somatic dysfunction of lumbar region: Secondary | ICD-10-CM | POA: Diagnosis not present

## 2018-05-22 DIAGNOSIS — M9914 Subluxation complex (vertebral) of sacral region: Secondary | ICD-10-CM | POA: Diagnosis not present

## 2018-05-22 DIAGNOSIS — M9905 Segmental and somatic dysfunction of pelvic region: Secondary | ICD-10-CM | POA: Diagnosis not present

## 2018-05-22 DIAGNOSIS — M5441 Lumbago with sciatica, right side: Secondary | ICD-10-CM | POA: Diagnosis not present

## 2018-05-25 DIAGNOSIS — M9914 Subluxation complex (vertebral) of sacral region: Secondary | ICD-10-CM | POA: Diagnosis not present

## 2018-05-25 DIAGNOSIS — M5441 Lumbago with sciatica, right side: Secondary | ICD-10-CM | POA: Diagnosis not present

## 2018-05-25 DIAGNOSIS — M9905 Segmental and somatic dysfunction of pelvic region: Secondary | ICD-10-CM | POA: Diagnosis not present

## 2018-05-25 DIAGNOSIS — M9903 Segmental and somatic dysfunction of lumbar region: Secondary | ICD-10-CM | POA: Diagnosis not present

## 2018-05-27 DIAGNOSIS — M5441 Lumbago with sciatica, right side: Secondary | ICD-10-CM | POA: Diagnosis not present

## 2018-05-27 DIAGNOSIS — M9905 Segmental and somatic dysfunction of pelvic region: Secondary | ICD-10-CM | POA: Diagnosis not present

## 2018-05-27 DIAGNOSIS — M9903 Segmental and somatic dysfunction of lumbar region: Secondary | ICD-10-CM | POA: Diagnosis not present

## 2018-05-27 DIAGNOSIS — M9914 Subluxation complex (vertebral) of sacral region: Secondary | ICD-10-CM | POA: Diagnosis not present

## 2018-05-29 DIAGNOSIS — M9903 Segmental and somatic dysfunction of lumbar region: Secondary | ICD-10-CM | POA: Diagnosis not present

## 2018-05-29 DIAGNOSIS — M5441 Lumbago with sciatica, right side: Secondary | ICD-10-CM | POA: Diagnosis not present

## 2018-05-29 DIAGNOSIS — M9914 Subluxation complex (vertebral) of sacral region: Secondary | ICD-10-CM | POA: Diagnosis not present

## 2018-05-29 DIAGNOSIS — M9905 Segmental and somatic dysfunction of pelvic region: Secondary | ICD-10-CM | POA: Diagnosis not present

## 2018-06-01 DIAGNOSIS — M9905 Segmental and somatic dysfunction of pelvic region: Secondary | ICD-10-CM | POA: Diagnosis not present

## 2018-06-01 DIAGNOSIS — M9914 Subluxation complex (vertebral) of sacral region: Secondary | ICD-10-CM | POA: Diagnosis not present

## 2018-06-01 DIAGNOSIS — M5441 Lumbago with sciatica, right side: Secondary | ICD-10-CM | POA: Diagnosis not present

## 2018-06-01 DIAGNOSIS — M9903 Segmental and somatic dysfunction of lumbar region: Secondary | ICD-10-CM | POA: Diagnosis not present

## 2018-06-02 DIAGNOSIS — M47819 Spondylosis without myelopathy or radiculopathy, site unspecified: Secondary | ICD-10-CM | POA: Diagnosis not present

## 2018-06-05 DIAGNOSIS — M9903 Segmental and somatic dysfunction of lumbar region: Secondary | ICD-10-CM | POA: Diagnosis not present

## 2018-06-05 DIAGNOSIS — M9905 Segmental and somatic dysfunction of pelvic region: Secondary | ICD-10-CM | POA: Diagnosis not present

## 2018-06-05 DIAGNOSIS — M9914 Subluxation complex (vertebral) of sacral region: Secondary | ICD-10-CM | POA: Diagnosis not present

## 2018-06-05 DIAGNOSIS — M5441 Lumbago with sciatica, right side: Secondary | ICD-10-CM | POA: Diagnosis not present

## 2018-06-10 DIAGNOSIS — M9914 Subluxation complex (vertebral) of sacral region: Secondary | ICD-10-CM | POA: Diagnosis not present

## 2018-06-10 DIAGNOSIS — M5441 Lumbago with sciatica, right side: Secondary | ICD-10-CM | POA: Diagnosis not present

## 2018-06-10 DIAGNOSIS — M9903 Segmental and somatic dysfunction of lumbar region: Secondary | ICD-10-CM | POA: Diagnosis not present

## 2018-06-10 DIAGNOSIS — M9905 Segmental and somatic dysfunction of pelvic region: Secondary | ICD-10-CM | POA: Diagnosis not present

## 2018-06-17 DIAGNOSIS — M5441 Lumbago with sciatica, right side: Secondary | ICD-10-CM | POA: Diagnosis not present

## 2018-06-17 DIAGNOSIS — M9905 Segmental and somatic dysfunction of pelvic region: Secondary | ICD-10-CM | POA: Diagnosis not present

## 2018-06-17 DIAGNOSIS — M9914 Subluxation complex (vertebral) of sacral region: Secondary | ICD-10-CM | POA: Diagnosis not present

## 2018-06-17 DIAGNOSIS — M9903 Segmental and somatic dysfunction of lumbar region: Secondary | ICD-10-CM | POA: Diagnosis not present

## 2018-06-24 DIAGNOSIS — M5441 Lumbago with sciatica, right side: Secondary | ICD-10-CM | POA: Diagnosis not present

## 2018-06-24 DIAGNOSIS — M9905 Segmental and somatic dysfunction of pelvic region: Secondary | ICD-10-CM | POA: Diagnosis not present

## 2018-06-24 DIAGNOSIS — M9903 Segmental and somatic dysfunction of lumbar region: Secondary | ICD-10-CM | POA: Diagnosis not present

## 2018-06-24 DIAGNOSIS — M9914 Subluxation complex (vertebral) of sacral region: Secondary | ICD-10-CM | POA: Diagnosis not present

## 2018-07-01 DIAGNOSIS — M9914 Subluxation complex (vertebral) of sacral region: Secondary | ICD-10-CM | POA: Diagnosis not present

## 2018-07-01 DIAGNOSIS — M9905 Segmental and somatic dysfunction of pelvic region: Secondary | ICD-10-CM | POA: Diagnosis not present

## 2018-07-01 DIAGNOSIS — M9903 Segmental and somatic dysfunction of lumbar region: Secondary | ICD-10-CM | POA: Diagnosis not present

## 2018-07-01 DIAGNOSIS — M5441 Lumbago with sciatica, right side: Secondary | ICD-10-CM | POA: Diagnosis not present

## 2018-08-12 DIAGNOSIS — Z87440 Personal history of urinary (tract) infections: Secondary | ICD-10-CM | POA: Diagnosis not present

## 2018-08-12 DIAGNOSIS — G4700 Insomnia, unspecified: Secondary | ICD-10-CM | POA: Diagnosis not present

## 2018-08-12 DIAGNOSIS — J302 Other seasonal allergic rhinitis: Secondary | ICD-10-CM | POA: Diagnosis not present

## 2018-08-12 DIAGNOSIS — Z6827 Body mass index (BMI) 27.0-27.9, adult: Secondary | ICD-10-CM | POA: Diagnosis not present

## 2018-08-12 DIAGNOSIS — Z1329 Encounter for screening for other suspected endocrine disorder: Secondary | ICD-10-CM | POA: Diagnosis not present

## 2018-08-12 DIAGNOSIS — Z0000 Encounter for general adult medical examination without abnormal findings: Secondary | ICD-10-CM | POA: Diagnosis not present

## 2018-09-02 DIAGNOSIS — Z9882 Breast implant status: Secondary | ICD-10-CM | POA: Diagnosis not present

## 2018-09-02 DIAGNOSIS — N6001 Solitary cyst of right breast: Secondary | ICD-10-CM | POA: Diagnosis not present

## 2018-09-02 DIAGNOSIS — N6313 Unspecified lump in the right breast, lower outer quadrant: Secondary | ICD-10-CM | POA: Diagnosis not present

## 2018-09-02 DIAGNOSIS — N6002 Solitary cyst of left breast: Secondary | ICD-10-CM | POA: Diagnosis not present

## 2018-09-02 DIAGNOSIS — Z0000 Encounter for general adult medical examination without abnormal findings: Secondary | ICD-10-CM | POA: Diagnosis not present

## 2018-09-02 DIAGNOSIS — Z1239 Encounter for other screening for malignant neoplasm of breast: Secondary | ICD-10-CM | POA: Diagnosis not present

## 2018-09-02 DIAGNOSIS — R922 Inconclusive mammogram: Secondary | ICD-10-CM | POA: Diagnosis not present

## 2018-09-04 DIAGNOSIS — N3090 Cystitis, unspecified without hematuria: Secondary | ICD-10-CM | POA: Diagnosis not present

## 2018-09-04 DIAGNOSIS — J069 Acute upper respiratory infection, unspecified: Secondary | ICD-10-CM | POA: Diagnosis not present

## 2018-09-04 DIAGNOSIS — M79602 Pain in left arm: Secondary | ICD-10-CM | POA: Diagnosis not present

## 2018-09-04 DIAGNOSIS — R3915 Urgency of urination: Secondary | ICD-10-CM | POA: Diagnosis not present

## 2018-09-16 DIAGNOSIS — J029 Acute pharyngitis, unspecified: Secondary | ICD-10-CM | POA: Diagnosis not present

## 2018-09-16 DIAGNOSIS — R5383 Other fatigue: Secondary | ICD-10-CM | POA: Diagnosis not present

## 2018-09-16 DIAGNOSIS — Z87440 Personal history of urinary (tract) infections: Secondary | ICD-10-CM | POA: Diagnosis not present

## 2018-09-16 DIAGNOSIS — Z13228 Encounter for screening for other metabolic disorders: Secondary | ICD-10-CM | POA: Diagnosis not present

## 2018-09-16 DIAGNOSIS — Z1321 Encounter for screening for nutritional disorder: Secondary | ICD-10-CM | POA: Diagnosis not present

## 2019-02-03 DIAGNOSIS — T753XXA Motion sickness, initial encounter: Secondary | ICD-10-CM | POA: Diagnosis not present

## 2019-02-03 DIAGNOSIS — N3000 Acute cystitis without hematuria: Secondary | ICD-10-CM | POA: Diagnosis not present

## 2019-02-03 DIAGNOSIS — R51 Headache: Secondary | ICD-10-CM | POA: Diagnosis not present

## 2019-02-03 DIAGNOSIS — J302 Other seasonal allergic rhinitis: Secondary | ICD-10-CM | POA: Diagnosis not present

## 2019-02-10 DIAGNOSIS — N6002 Solitary cyst of left breast: Secondary | ICD-10-CM | POA: Diagnosis not present

## 2019-02-10 DIAGNOSIS — N6001 Solitary cyst of right breast: Secondary | ICD-10-CM | POA: Diagnosis not present

## 2019-02-10 DIAGNOSIS — Z1239 Encounter for other screening for malignant neoplasm of breast: Secondary | ICD-10-CM | POA: Diagnosis not present

## 2019-02-10 DIAGNOSIS — N6313 Unspecified lump in the right breast, lower outer quadrant: Secondary | ICD-10-CM | POA: Diagnosis not present

## 2019-02-10 DIAGNOSIS — R922 Inconclusive mammogram: Secondary | ICD-10-CM | POA: Diagnosis not present

## 2019-05-06 DIAGNOSIS — Z124 Encounter for screening for malignant neoplasm of cervix: Secondary | ICD-10-CM | POA: Diagnosis not present

## 2019-05-06 DIAGNOSIS — Z01419 Encounter for gynecological examination (general) (routine) without abnormal findings: Secondary | ICD-10-CM | POA: Diagnosis not present

## 2019-05-06 DIAGNOSIS — Z1151 Encounter for screening for human papillomavirus (HPV): Secondary | ICD-10-CM | POA: Diagnosis not present

## 2019-05-06 DIAGNOSIS — Z130 Encounter for screening for diseases of the blood and blood-forming organs and certain disorders involving the immune mechanism: Secondary | ICD-10-CM | POA: Diagnosis not present

## 2020-01-11 ENCOUNTER — Telehealth: Payer: Self-pay | Admitting: Physical Medicine and Rehabilitation

## 2020-01-11 NOTE — Telephone Encounter (Signed)
Pt called wanting to set an appt in regards to the right lower side of her back; pt has [employer].   [phone]

## 2020-01-12 NOTE — Telephone Encounter (Signed)
Scheduled for 7/13 at 0900.

## 2020-01-25 ENCOUNTER — Ambulatory Visit: Payer: BLUE CROSS/BLUE SHIELD | Admitting: Physical Medicine and Rehabilitation
